# Patient Record
Sex: Female | Born: 1980 | Race: Black or African American | Hispanic: No | Marital: Single | Smoking: Current every day smoker
Health system: Southern US, Community
[De-identification: ages and names within clinical notes are randomized; demographics above are authoritative.]

## PROBLEM LIST (undated history)

## (undated) DIAGNOSIS — F32A Depression, unspecified: Secondary | ICD-10-CM

## (undated) DIAGNOSIS — B009 Herpesviral infection, unspecified: Secondary | ICD-10-CM

## (undated) DIAGNOSIS — F329 Major depressive disorder, single episode, unspecified: Secondary | ICD-10-CM

## (undated) HISTORY — DX: Depression, unspecified: F32.A

---
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
## 2005-12-17 ENCOUNTER — Emergency Department: Payer: Self-pay | Admitting: Emergency Medicine

## 2006-11-09 ENCOUNTER — Emergency Department: Payer: Self-pay | Admitting: Emergency Medicine

## 2007-01-23 ENCOUNTER — Emergency Department: Payer: Self-pay | Admitting: Emergency Medicine

## 2007-04-24 ENCOUNTER — Emergency Department: Payer: Self-pay | Admitting: Emergency Medicine

## 2008-07-06 DIAGNOSIS — A6000 Herpesviral infection of urogenital system, unspecified: Secondary | ICD-10-CM | POA: Insufficient documentation

## 2009-08-31 ENCOUNTER — Emergency Department: Payer: Self-pay | Admitting: Emergency Medicine

## 2010-08-09 ENCOUNTER — Emergency Department: Payer: Self-pay | Admitting: Internal Medicine

## 2012-01-11 ENCOUNTER — Emergency Department: Payer: Self-pay | Admitting: Emergency Medicine

## 2012-01-15 LAB — WOUND CULTURE

## 2014-06-07 ENCOUNTER — Emergency Department: Payer: Self-pay | Admitting: Emergency Medicine

## 2014-06-07 LAB — COMPREHENSIVE METABOLIC PANEL
AST: 22 U/L (ref 15–37)
Albumin: 3.8 g/dL (ref 3.4–5.0)
Alkaline Phosphatase: 80 U/L
Anion Gap: 7 (ref 7–16)
BUN: 16 mg/dL (ref 7–18)
Bilirubin,Total: 0.3 mg/dL (ref 0.2–1.0)
CHLORIDE: 106 mmol/L (ref 98–107)
Calcium, Total: 9.2 mg/dL (ref 8.5–10.1)
Co2: 28 mmol/L (ref 21–32)
Creatinine: 1.03 mg/dL (ref 0.60–1.30)
EGFR (African American): >60
EGFR (Non-African Amer.): >60
GLUCOSE: 121 mg/dL — AB (ref 65–99)
Osmolality: 284 (ref 275–301)
Potassium: 3.5 mmol/L (ref 3.5–5.1)
SGPT (ALT): 17 U/L
Sodium: 141 mmol/L (ref 136–145)
TOTAL PROTEIN: 7.7 g/dL (ref 6.4–8.2)

## 2014-06-07 LAB — CBC WITH DIFFERENTIAL/PLATELET
BASOS PCT: 0.5 %
Basophil #: 0.1 10*3/uL (ref 0.0–0.1)
EOS PCT: 0.9 %
Eosinophil #: 0.1 10*3/uL (ref 0.0–0.7)
HCT: 41.3 % (ref 35.0–47.0)
HGB: 13.4 g/dL (ref 12.0–16.0)
LYMPHS ABS: 3 10*3/uL (ref 1.0–3.6)
LYMPHS PCT: 29.8 %
MCH: 27.5 pg (ref 26.0–34.0)
MCHC: 32.3 g/dL (ref 32.0–36.0)
MCV: 85 fL (ref 80–100)
MONO ABS: 1.3 x10 3/mm — AB (ref 0.2–0.9)
MONOS PCT: 13.5 %
Neutrophil #: 5.5 10*3/uL (ref 1.4–6.5)
Neutrophil %: 55.3 %
Platelet: 219 10*3/uL (ref 150–440)
RBC: 4.85 10*6/uL (ref 3.80–5.20)
RDW: 14.8 % — ABNORMAL HIGH (ref 11.5–14.5)
WBC: 10 10*3/uL (ref 3.6–11.0)

## 2014-06-07 LAB — URINALYSIS, COMPLETE
BILIRUBIN, UR: NEGATIVE
Bacteria: NONE SEEN
Glucose,UR: NEGATIVE mg/dL (ref 0–75)
KETONE: NEGATIVE
Nitrite: NEGATIVE
PH: 5 (ref 4.5–8.0)
Protein: 30
RBC,UR: 8 /HPF (ref 0–5)
SPECIFIC GRAVITY: 1.028 (ref 1.003–1.030)
Squamous Epithelial: 1
WBC UR: 20 /HPF (ref 0–5)

## 2014-06-07 LAB — LIPASE, BLOOD: LIPASE: 166 U/L (ref 73–393)

## 2014-06-07 LAB — PREGNANCY, URINE: Pregnancy Test, Urine: NEGATIVE m[IU]/mL

## 2014-09-01 ENCOUNTER — Emergency Department: Payer: Self-pay | Admitting: Physician Assistant

## 2015-03-14 ENCOUNTER — Emergency Department: Payer: Self-pay

## 2015-03-14 ENCOUNTER — Emergency Department
Admission: EM | Admit: 2015-03-14 | Discharge: 2015-03-14 | Disposition: A | Discharge status: Home / Self Care | Payer: Self-pay | Attending: Emergency Medicine | Admitting: Emergency Medicine

## 2015-03-14 ENCOUNTER — Encounter: Payer: Self-pay | Admitting: Emergency Medicine

## 2015-03-14 DIAGNOSIS — R9389 Abnormal findings on diagnostic imaging of other specified body structures: Secondary | ICD-10-CM

## 2015-03-14 DIAGNOSIS — N8500 Endometrial hyperplasia, unspecified: Secondary | ICD-10-CM | POA: Insufficient documentation

## 2015-03-14 DIAGNOSIS — R102 Pelvic and perineal pain: Secondary | ICD-10-CM

## 2015-03-14 DIAGNOSIS — Z3202 Encounter for pregnancy test, result negative: Secondary | ICD-10-CM | POA: Insufficient documentation

## 2015-03-14 DIAGNOSIS — Z720 Tobacco use: Secondary | ICD-10-CM | POA: Insufficient documentation

## 2015-03-14 LAB — URINALYSIS COMPLETE WITH MICROSCOPIC (ARMC ONLY)
Bacteria, UA: NONE SEEN
Bilirubin Urine: NEGATIVE
Glucose, UA: NEGATIVE mg/dL
HGB URINE DIPSTICK: NEGATIVE
Ketones, ur: NEGATIVE mg/dL
LEUKOCYTES UA: NEGATIVE
NITRITE: NEGATIVE
PROTEIN: NEGATIVE mg/dL
SPECIFIC GRAVITY, URINE: 1.027 (ref 1.005–1.030)
pH: 5 (ref 5.0–8.0)

## 2015-03-14 LAB — CHLAMYDIA/NGC RT PCR (ARMC ONLY)
CHLAMYDIA TR: NOT DETECTED
N GONORRHOEAE: NOT DETECTED

## 2015-03-14 LAB — WET PREP, GENITAL
Clue Cells Wet Prep HPF POC: NONE SEEN
Trich, Wet Prep: NONE SEEN
YEAST WET PREP: NONE SEEN

## 2015-03-14 LAB — POCT PREGNANCY, URINE: PREG TEST UR: NEGATIVE

## 2015-03-14 NOTE — ED Notes (Signed)
Patient transported to Ultrasound 

## 2015-03-14 NOTE — ED Provider Notes (Signed)
[facility] Emergency Department Provider Note ____________________________________________  Time seen: 1026  I have reviewed the triage vital signs and the nursing notes.  HISTORY  Chief Complaint  Pelvic Pain and Flank Pain  HPI Jasmine Blankenship is a 34 y.o. female reports to the ED with 2-3 days complaint of lower abdominal pain and fullness as well as some urinary frequency. The patient describesthat she has been on Depo-Provera shots for the last 20 years due to menorrhagia and metrorrhagia. She also reports that this is her third visit over the last 6-12 months for complaints of pelvic discomfort. She each time is been treated for various other findings, but continues to note some pelvic discomfort, bloating, and fullness. She describes she does not have menstrual periods due to stepping shot, only noting some scant spotting once monthly. She reports that she is due for her annual physical exam and about 4 weeks, with the local health department. She describes the pain and discomfort is crampy and sharp at times she is also had an intermittent scant yellowish vaginal discharge which has been malodorous at times. She has not sought treatment at the health Department for her chronic complaint of pelvic discomfort. She is concerned that no one has done an ultrasound to determine the cause of her symptoms.  History reviewed. No pertinent past medical history.  There are no active problems to display for this patient.  History reviewed. No pertinent past surgical history.  Current Outpatient Rx  Name  Route  Sig  Dispense  Refill  . medroxyPROGESTERone (DEPO-PROVERA) 150 MG/ML injection   Intramuscular   Inject 150 mg into the muscle every 3 (three) months.          Allergies Review of patient's allergies indicates no known allergies.  History reviewed. No pertinent family history.  Social History Social History  Substance Use Topics  . Smoking status:  Current Every Day Smoker  . Smokeless tobacco: None  . Alcohol Use: Yes   Review of Systems  Constitutional: Negative for fever. Eyes: Negative for visual changes. ENT: Negative for sore throat. Cardiovascular: Negative for chest pain. Respiratory: Negative for shortness of breath. Gastrointestinal: Negative for abdominal pain, vomiting and diarrhea. Genitourinary: Negative for dysuria. Reports vaginal discharge and pelvic pain as above Musculoskeletal: Negative for back pain. Skin: Negative for rash. Neurological: Negative for headaches, focal weakness or numbness. ____________________________________________  PHYSICAL EXAM:  VITAL SIGNS: ED Triage Vitals  Enc Vitals Group     BP 03/14/15 0944 115/78 mmHg     Pulse Rate 03/14/15 0944 74     Resp 03/14/15 0944 18     Temp 03/14/15 0944 98.2 F (36.8 C)     Temp Source 03/14/15 0944 Oral     SpO2 03/14/15 0944 99 %     Weight 03/14/15 0944 151 lb (68.493 kg)     Height 03/14/15 0944  (1.778 m)     Head Cir --      Peak Flow --      Pain Score 03/14/15 0944 10     Pain Loc --      Pain Edu? --      Excl. in GC? --    Constitutional: Alert and oriented. Well appearing and in no distress. Eyes: Conjunctivae are normal. PERRL. Normal extraocular movements. ENT   Head: Normocephalic and atraumatic.   Nose: No congestion/rhinorrhea.   Mouth/Throat: Mucous membranes are moist.   Neck: Supple. No thyromegaly. Hematological/Lymphatic/Immunological: No cervical lymphadenopathy. Cardiovascular: Normal  rate, regular rhythm.  Respiratory: Normal respiratory effort. No wheezes/rales/rhonchi. Gastrointestinal: Soft and nontender. No distention. GU: Normal external genitalia. Cervix non-friable with nabothian cysts noted. Scant whitish discharge noted. No cervical tenderness or adnexal masses.  Musculoskeletal: Nontender with normal range of motion in all extremities.  Neurologic:  Normal gait without ataxia.  Normal speech and language. No gross focal neurologic deficits are appreciated. Skin:  Skin is warm, dry and intact. No rash noted. Psychiatric: Mood and affect are normal. Patient exhibits appropriate insight and judgment. ____________________________________________    LABS (pertinent positives/negatives) Labs Reviewed  WET PREP, GENITAL - Abnormal; Notable for the following:    WBC, Wet Prep HPF POC MANY (*)    All other components within normal limits  URINALYSIS COMPLETEWITH MICROSCOPIC ([facility] ONLY) - Abnormal; Notable for the following:    Color, Urine YELLOW (*)    APPearance CLEAR (*)    Squamous Epithelial / LPF 0-5 (*)    All other components within normal limits  CHLAMYDIA/NGC RT PCR ([facility] ONLY)  POC URINE PREG, ED  ____________________________________________   RADIOLOGY Pelvic U/S IMPRESSION: 1. Thickened heterogeneous endometrium, 18.4 mm. Endometrial thickness is considered abnormal. Consider follow-up by [place] in 6-8 weeks, during the week immediately following menses (exam timing is critical). 2. No discrete fibroids identified. 3. Normal appearance of the ovaries. ____________________________________________  INITIAL IMPRESSION / ASSESSMENT AND PLAN / ED COURSE  Reassurance to the patient regarding the findings on exam and lab testing. Endometrial thickening likely consistent with use of medroxyprogesterone for menorrhagia. Suggest follow-up with [facility] as scheduled.  ____________________________________________  FINAL CLINICAL IMPRESSION(S) / ED DIAGNOSES  Final diagnoses:  Pelvic pain in female  Endometrial thickening on ultra sound     Lissa Hoard, PA-C 03/14/15 1719  Sharman Cheek, MD 03/15/15 0900

## 2015-03-14 NOTE — ED Notes (Signed)
Patient reports 2-3 day history of lower abdominal pain along with pain in mid lower back, reports urinary frequency and odor to urine as well.

## 2015-03-14 NOTE — Discharge Instructions (Signed)
Pelvic Pain Female pelvic pain can be caused by many different things and start from a variety of places. Pelvic pain refers to pain that is located in the lower half of the abdomen and between your hips. The pain may occur over a short period of time (acute) or may be reoccurring (chronic). The cause of pelvic pain may be related to disorders affecting the female reproductive organs (gynecologic), but it may also be related to the bladder, kidney stones, an intestinal complication, or muscle or skeletal problems. Getting help right away for pelvic pain is important, especially if there has been severe, sharp, or a sudden onset of unusual pain. It is also important to get help right away because some types of pelvic pain can be life threatening.  CAUSES  Below are only some of the causes of pelvic pain. The causes of pelvic pain can be in one of several categories.   Gynecologic.  Pelvic inflammatory disease.  Sexually transmitted infection.  Ovarian cyst or a twisted ovarian ligament (ovarian torsion).  Uterine lining that grows outside the uterus (endometriosis).  Fibroids, cysts, or tumors.  Ovulation.  Pregnancy.  Pregnancy that occurs outside the uterus (ectopic pregnancy).  Miscarriage.  Labor.  Abruption of the placenta or ruptured uterus.  Infection.  Uterine infection (endometritis).  Bladder infection.  Diverticulitis.  Miscarriage related to a uterine infection (septic abortion).  Bladder.  Inflammation of the bladder (cystitis).  Kidney stone(s).  Gastrointestinal.  Constipation.  Diverticulitis.  Neurologic.  Trauma.  Feeling pelvic pain because of mental or emotional causes (psychosomatic).  Cancers of the bowel or pelvis. EVALUATION  Your caregiver will want to take a careful history of your concerns. This includes recent changes in your health, a careful gynecologic history of your periods (menses), and a sexual history. Obtaining your family  history and medical history is also important. Your caregiver may suggest a pelvic exam. A pelvic exam will help identify the location and severity of the pain. It also helps in the evaluation of which organ system may be involved. In order to identify the cause of the pelvic pain and be properly treated, your caregiver may order tests. These tests may include:   A pregnancy test.  Pelvic ultrasonography.  An X-ray exam of the abdomen.  A urinalysis or evaluation of vaginal discharge.  Blood tests. HOME CARE INSTRUCTIONS   Only take over-the-counter or prescription medicines for pain, discomfort, or fever as directed by your caregiver.   Rest as directed by your caregiver.   Eat a balanced diet.   Drink enough fluids to make your urine clear or pale yellow, or as directed.   Avoid sexual intercourse if it causes pain.   Apply warm or cold compresses to the lower abdomen depending on which one helps the pain.   Avoid stressful situations.   Keep a journal of your pelvic pain. Write down when it started, where the pain is located, and if there are things that seem to be associated with the pain, such as food or your menstrual cycle.  Follow up with your caregiver as directed.  SEEK MEDICAL CARE IF:  Your medicine does not help your pain.  You have abnormal vaginal discharge. SEEK IMMEDIATE MEDICAL CARE IF:   You have heavy bleeding from the vagina.   Your pelvic pain increases.   You feel light-headed or faint.   You have chills.   You have pain with urination or blood in your urine.   You have uncontrolled diarrhea   or vomiting.   You have a fever or persistent symptoms for more than 3 days.  You have a fever and your symptoms suddenly get worse.   You are being physically or sexually abused.  MAKE SURE YOU:  Understand these instructions.  Will watch your condition.  Will get help if you are not doing well or get worse. Document Released:  05/19/2004 Document Revised: 11/06/2013 Document Reviewed: 10/12/2011 [facility] Patient Information 2015 [place], [place]. This information is not intended to replace advice given to you by your health care provider. Make sure you discuss any questions you have with your health care provider.  Your exam shows thickening of the lining of the uterus.  This is the portion that sheds when you have a period. You should follow-up with your provider at the Health Department as scheduled next month. Discuss your treatment options and repeat the ultrasound with the health department within 2 months.

## 2016-07-06 HISTORY — PX: DILATION AND CURETTAGE OF UTERUS: SHX78

## 2017-03-05 IMAGING — US US TRANSVAGINAL NON-OB
1 series · 13 of 25 positions shown · non-contrast
Comparison: None

CLINICAL DATA: Pelvic pain for 1 year. Depo-Provera for 15 years.
Unknown LMP.

EXAM:
TRANSABDOMINAL AND TRANSVAGINAL ULTRASOUND OF PELVIS
TECHNIQUE: Both transabdominal and transvaginal ultrasound examinations of the
pelvis were performed. Transabdominal technique was performed for
global imaging of the pelvis including uterus, ovaries, adnexal
regions, and pelvic cul-de-sac. It was necessary to proceed with
endovaginal exam following the transabdominal exam to visualize the
endometrium and ovaries.

[Series 1: us transvaginal non-ob · 0.20mm/px · 13 of 66 slices shown]
[im 1/66]
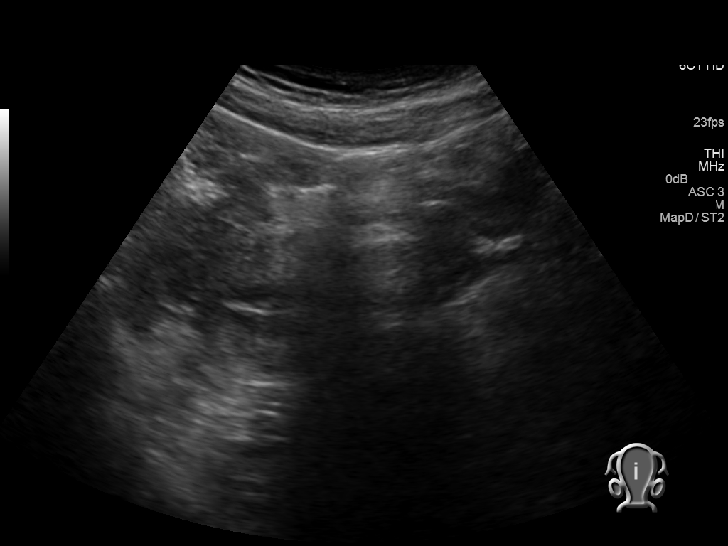
[im 6/66]
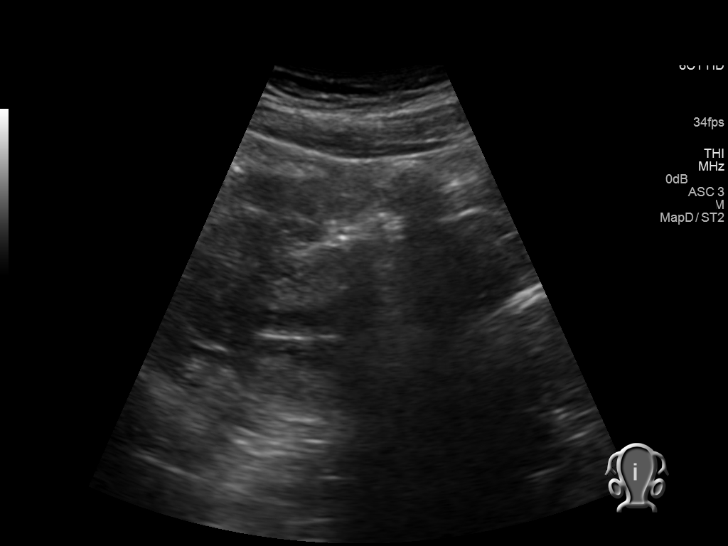
[im 11/66]
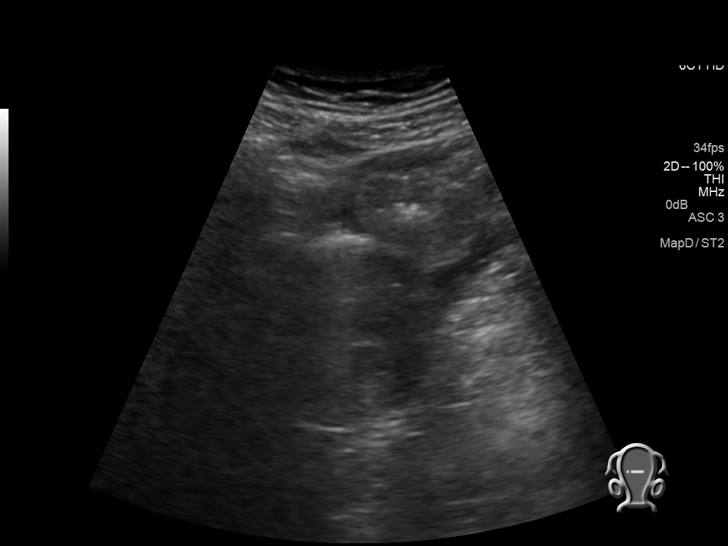
[im 17/66]
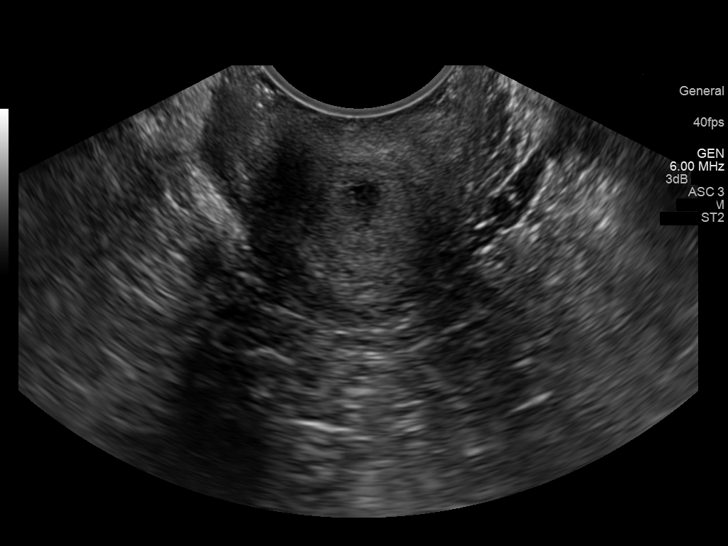
[im 22/66]
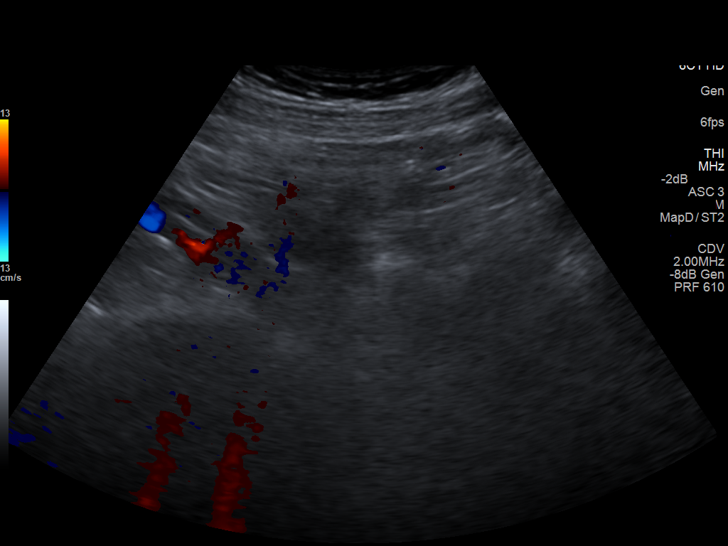
[im 28/66]
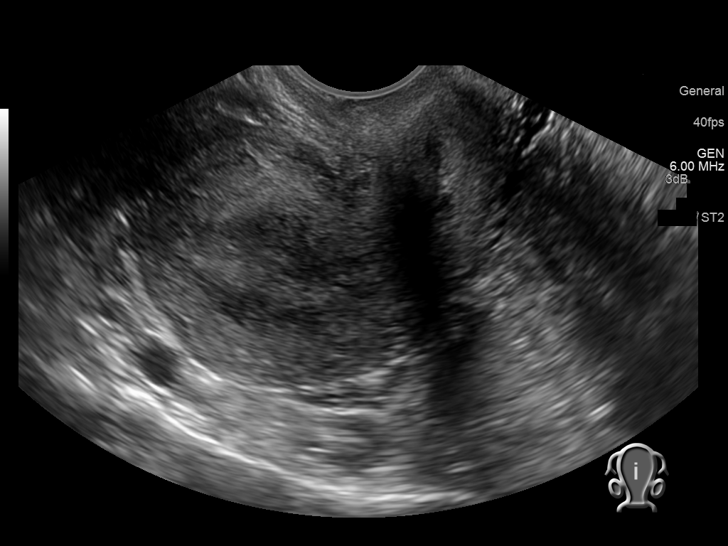
[im 33/66]
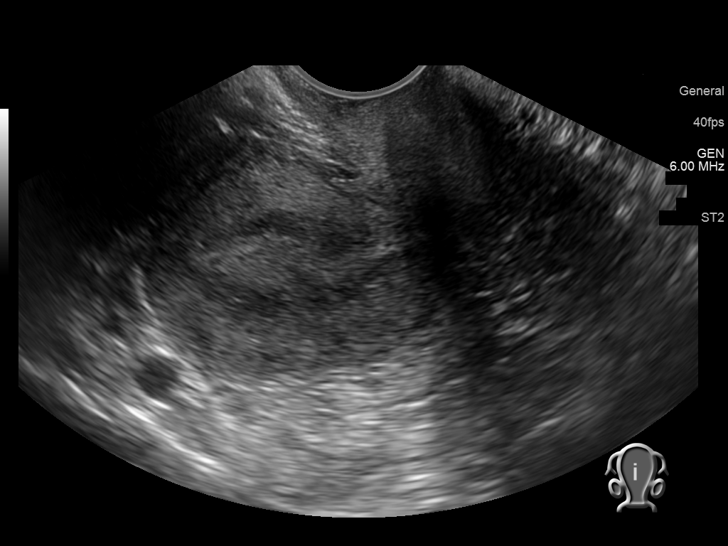
[im 38/66]
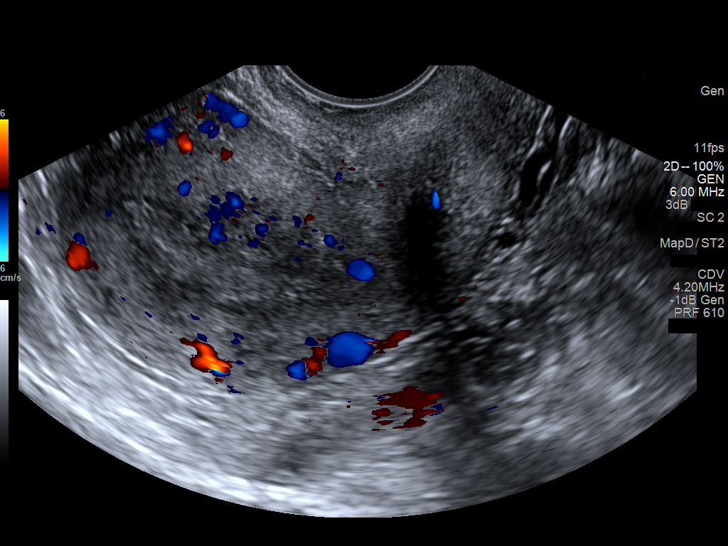
[im 44/66]
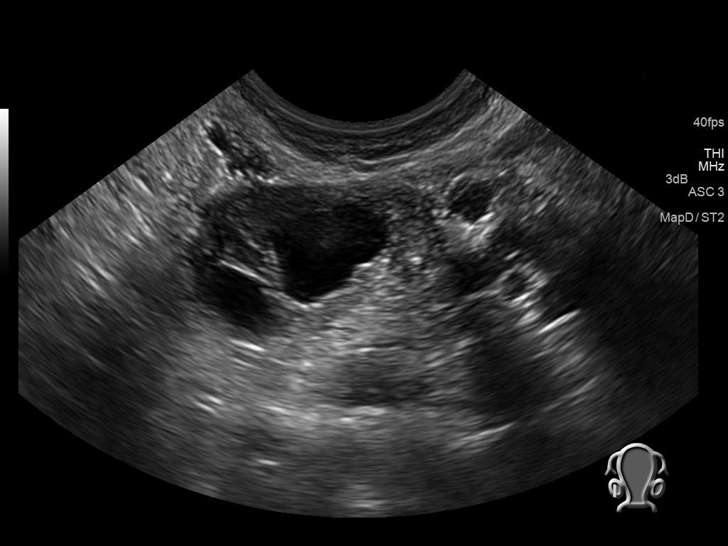
[im 49/66]
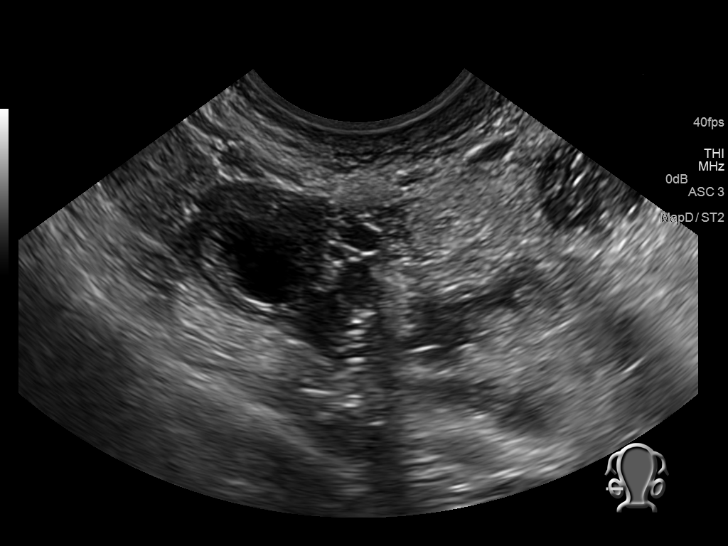
[im 55/66]
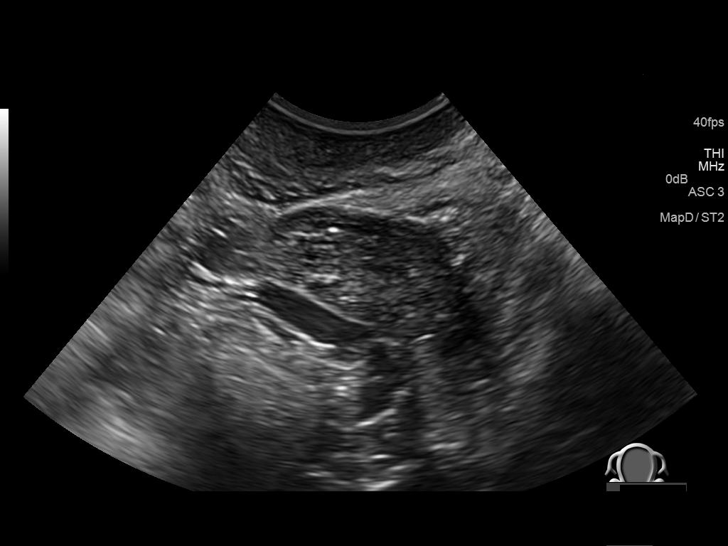
[im 60/66]
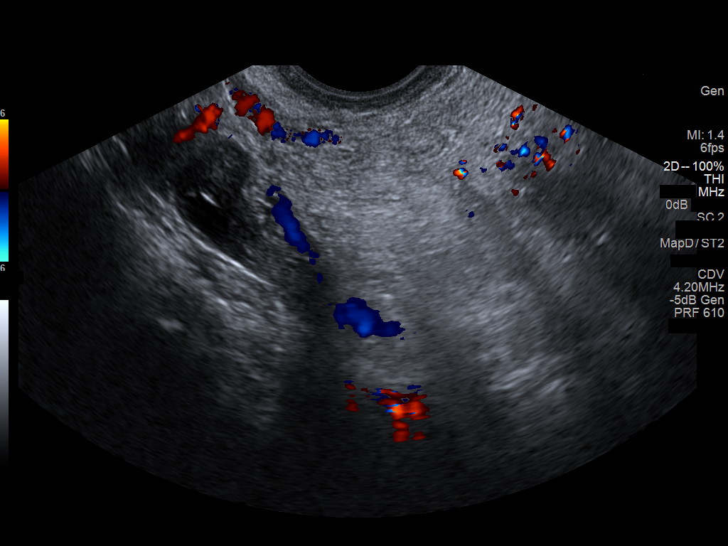
[im 66/66]
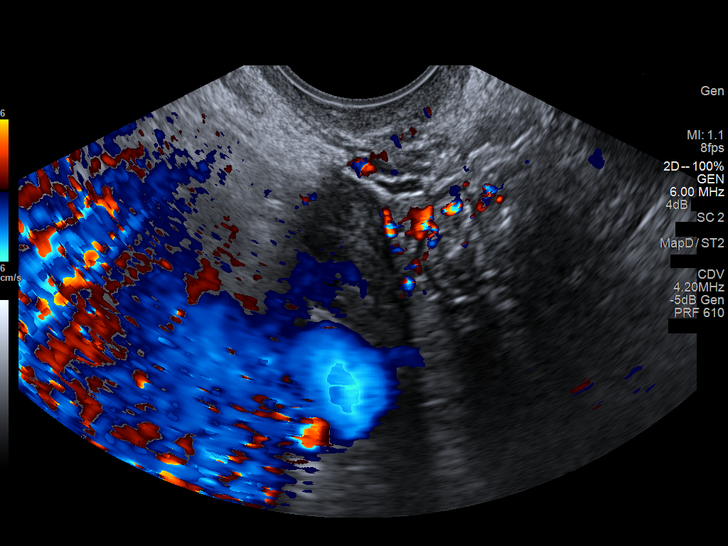

[13 of 25 positions shown; findings below may reference images not displayed]

FINDINGS: Uterus

Measurements: 8.4 x 4.0 x 5.3 cm.. No fibroids or other mass
visualized.

Endometrium

Thickness: 18.4 mm. It is heterogeneous and hypervascular. Fluid is
identified in the lower uterine segment.

Right ovary

Measurements: 2.7 x 1.6 x 2.9 cm. Follicles are present.

Left ovary

Measurements: 1.9 x 0.9 x 1.2 cm.. Follicles are present.

Other findings

No free pelvic fluid.
IMPRESSION: 1. Thickened heterogeneous endometrium, 18.4 mm. Endometrial
thickness is considered abnormal. Consider follow-up by US in 6-8
weeks, during the week immediately following menses (exam timing is
critical).
2. No discrete fibroids identified.
3. Normal appearance of the ovaries.

## 2017-05-07 ENCOUNTER — Encounter: Payer: Self-pay | Admitting: Emergency Medicine

## 2017-05-07 ENCOUNTER — Emergency Department
Admission: EM | Admit: 2017-05-07 | Discharge: 2017-05-07 | Disposition: A | Discharge status: Home / Self Care | Payer: Self-pay | Attending: Emergency Medicine | Admitting: Emergency Medicine

## 2017-05-07 DIAGNOSIS — N760 Acute vaginitis: Secondary | ICD-10-CM | POA: Insufficient documentation

## 2017-05-07 DIAGNOSIS — Z79899 Other long term (current) drug therapy: Secondary | ICD-10-CM | POA: Insufficient documentation

## 2017-05-07 DIAGNOSIS — B9689 Other specified bacterial agents as the cause of diseases classified elsewhere: Secondary | ICD-10-CM

## 2017-05-07 DIAGNOSIS — F17200 Nicotine dependence, unspecified, uncomplicated: Secondary | ICD-10-CM | POA: Insufficient documentation

## 2017-05-07 LAB — URINALYSIS, COMPLETE (UACMP) WITH MICROSCOPIC
BACTERIA UA: NONE SEEN
Bilirubin Urine: NEGATIVE
Glucose, UA: NEGATIVE mg/dL
Hgb urine dipstick: NEGATIVE
Ketones, ur: NEGATIVE mg/dL
NITRITE: NEGATIVE
PROTEIN: NEGATIVE mg/dL
SPECIFIC GRAVITY, URINE: 1.02 (ref 1.005–1.030)
pH: 5 (ref 5.0–8.0)

## 2017-05-07 LAB — CHLAMYDIA/NGC RT PCR (ARMC ONLY)
Chlamydia Tr: NOT DETECTED
N gonorrhoeae: NOT DETECTED

## 2017-05-07 LAB — WET PREP, GENITAL
SPERM: NONE SEEN
Trich, Wet Prep: NONE SEEN
YEAST WET PREP: NONE SEEN

## 2017-05-07 LAB — POCT PREGNANCY, URINE: PREG TEST UR: NEGATIVE

## 2017-05-07 MED ORDER — METRONIDAZOLE 500 MG PO TABS: 500.0000 mg | ORAL_TABLET | Freq: Two times a day (BID) | ORAL | 0 refills | Status: DC

## 2017-05-07 NOTE — Discharge Instructions (Signed)
Follow-up at the health department if any continued problems or doctor of your choice. Began taking Flagyl 500 mg twice a day for 7 days. Increase fluids. Tylenol or ibuprofen as needed for pain. You'll be called if the cultures are positive for anything.

## 2017-05-07 NOTE — ED Triage Notes (Signed)
Pt presents to ED c/o dysuria, foul odor , burning upon urination  X few weeks.

## 2017-05-07 NOTE — ED Provider Notes (Signed)
[facility] Emergency Department Provider Note  ___________________________________________   First MD Initiated Contact with Patient 05/07/17 1212     (approximate)  I have reviewed the triage vital signs and the nursing notes.   HISTORY  Chief Complaint Urinary Tract Infection  HPI Jasmine Blankenship is a 36 y.o. female for complaint of dysuria, foul odor  for a few weeks. Patient is sexually active and currently using Depo shot for birth control. patient denies any vaginal discharge but complains of odor.  History reviewed. No pertinent past medical history.  There are no active problems to display for this patient.   History reviewed. No pertinent surgical history.  Prior to Admission medications   Medication Sig Start Date End Date Taking? Authorizing Provider  medroxyPROGESTERone (DEPO-PROVERA) 150 MG/ML injection Inject 150 mg into the muscle every 3 (three) months. 03/13/1993   [provider]  metroNIDAZOLE (FLAGYL) 500 MG tablet Take 1 tablet (500 mg total) by mouth 2 (two) times daily. 05/07/17   Tommi RumpsSummers, Elza Sortor L, PA-C    Allergies Patient has no known allergies.  History reviewed. No pertinent family history.  Social History Social History  Substance Use Topics  . Smoking status: Current Every Day Smoker  . Smokeless tobacco: Current User  . Alcohol use Yes    Review of Systems Constitutional: No fever/chills Cardiovascular: Denies chest pain. Respiratory: Denies shortness of breath. Gastrointestinal: No abdominal pain.  No nausea, no vomiting.  No diarrhea.   Genitourinary: positive for dysuria. Denies vaginal discharge. Musculoskeletal: Negative for back pain. Skin: Negative for rash. Neurological: Negative for headaches ___________________________________________   PHYSICAL EXAM:  VITAL SIGNS: ED Triage Vitals [05/07/17 1137]  Enc Vitals Group     BP (!) 123/92     Pulse Rate 78     Resp 19     Temp 98.4 F  (36.9 C)     Temp Source Oral     SpO2 98 %     Weight 145 lb (65.8 kg)     Height 5\' 10"  (1.778 m)     Head Circumference      Peak Flow      Pain Score      Pain Loc      Pain Edu?      Excl. in GC?    Constitutional: Alert and oriented. Well appearing and in no acute distress. Eyes: Conjunctivae are normal.  Head: Atraumatic. Nose: No congestion/rhinnorhea. Neck: No stridor.   Cardiovascular: Normal rate, regular rhythm. Grossly normal heart sounds.  Good peripheral circulation. Respiratory: Normal respiratory effort.  No retractions. Lungs CTAB. Gastrointestinal: Soft and nontender. No distention.  Genitourinary:  Minimal discharge is noted vaginally. No external blistering is noted. Cultures were obtained. No adnexal masses or tenderness noted. Musculoskeletal: moves upper and lower extremities without difficulty.Normal gait was noted. Neurologic:  Normal speech and language. No gross focal neurologic deficits are appreciated.  Skin:  Skin is warm, dry and intact. No rash noted. Psychiatric: Mood and affect are normal. Speech and behavior are normal.  ____________________________________________   LABS (all labs ordered are listed, but only abnormal results are displayed)  Labs Reviewed  WET PREP, GENITAL - Abnormal; Notable for the following:       Result Value   Clue Cells Wet Prep HPF POC PRESENT (*)    WBC, Wet Prep HPF POC MODERATE (*)    All other components within normal limits  URINALYSIS, COMPLETE (UACMP) WITH MICROSCOPIC - Abnormal; Notable for the following:  Color, Urine YELLOW (*)    APPearance HAZY (*)    Leukocytes, UA TRACE (*)    Squamous Epithelial / LPF 0-5 (*)    All other components within normal limits  CHLAMYDIA/NGC RT PCR ([facility] ONLY)  POC URINE PREG, ED  POCT PREGNANCY, URINE    PROCEDURES  Procedure(s) performed: None  Procedures  Critical Care performed: No  ____________________________________________   INITIAL  IMPRESSION / ASSESSMENT AND PLAN / ED COURSE   Patient was made aware that she is being treated for bacterial vaginosis. Patient is given a prescription for Flagyl 500 mg for 7 days. Patient is to follow-up with provider of her choice or on his [place] health Department. She is aware that if her cultures show any problems then she will be called.  ____________________________________________   FINAL CLINICAL IMPRESSION(S) / ED DIAGNOSES  Final diagnoses:  BV (bacterial vaginosis)      NEW MEDICATIONS STARTED DURING THIS VISIT:  Discharge Medication List as of 05/07/2017  2:57 PM    START taking these medications   Details  metroNIDAZOLE (FLAGYL) 500 MG tablet Take 1 tablet (500 mg total) by mouth 2 (two) times daily., Starting Fri 05/07/2017, Print         Note:  This document was prepared using Dragon voice recognition software and may include unintentional dictation errors.    Tommi Rumps, PA-C 05/07/17 Dereck Ligas, MD 05/08/17 1630

## 2017-05-07 NOTE — ED Notes (Signed)
Pt c/o discomfort with urination, and foul odor after urination. 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 Pt states this has been going on x several weeks. Has tried OTC yeast infection treatment without relief.

## 2017-05-07 NOTE — ED Notes (Signed)
NAD noted at time of D/C. Pt denies questions or concerns. Pt ambulatory to the lobby at this time.  

## 2017-08-20 LAB — HM PAP SMEAR: HM Pap smear: NEGATIVE

## 2018-07-02 ENCOUNTER — Emergency Department
Admission: EM | Admit: 2018-07-02 | Discharge: 2018-07-02 | Disposition: A | Discharge status: Home / Self Care | Payer: Self-pay | Attending: Emergency Medicine | Admitting: Emergency Medicine

## 2018-07-02 ENCOUNTER — Other Ambulatory Visit: Payer: Self-pay

## 2018-07-02 ENCOUNTER — Encounter: Payer: Self-pay | Admitting: Emergency Medicine

## 2018-07-02 DIAGNOSIS — N898 Other specified noninflammatory disorders of vagina: Secondary | ICD-10-CM | POA: Insufficient documentation

## 2018-07-02 DIAGNOSIS — F329 Major depressive disorder, single episode, unspecified: Secondary | ICD-10-CM | POA: Insufficient documentation

## 2018-07-02 DIAGNOSIS — F17210 Nicotine dependence, cigarettes, uncomplicated: Secondary | ICD-10-CM | POA: Insufficient documentation

## 2018-07-02 HISTORY — DX: Herpesviral infection, unspecified: B00.9

## 2018-07-02 LAB — COMPREHENSIVE METABOLIC PANEL
ALT: 17 U/L (ref 0–44)
ANION GAP: 5 (ref 5–15)
AST: 20 U/L (ref 15–41)
Albumin: 4.2 g/dL (ref 3.5–5.0)
Alkaline Phosphatase: 68 U/L (ref 38–126)
BUN: 10 mg/dL (ref 6–20)
CALCIUM: 9.2 mg/dL (ref 8.9–10.3)
CHLORIDE: 106 mmol/L (ref 98–111)
CO2: 27 mmol/L (ref 22–32)
Creatinine, Ser: 0.91 mg/dL (ref 0.44–1.00)
GFR calc non Af Amer: >60 mL/min (ref >60)
Glucose, Bld: 102 mg/dL — ABNORMAL HIGH (ref 70–99)
Potassium: 3.8 mmol/L (ref 3.5–5.1)
SODIUM: 138 mmol/L (ref 135–145)
Total Bilirubin: 0.4 mg/dL (ref 0.3–1.2)
Total Protein: 7.6 g/dL (ref 6.5–8.1)

## 2018-07-02 LAB — URINALYSIS, ROUTINE W REFLEX MICROSCOPIC
Bilirubin Urine: NEGATIVE
Glucose, UA: NEGATIVE mg/dL
HGB URINE DIPSTICK: NEGATIVE
KETONES UR: NEGATIVE mg/dL
Leukocytes, UA: NEGATIVE
Nitrite: NEGATIVE
PROTEIN: NEGATIVE mg/dL
Specific Gravity, Urine: 1.005 (ref 1.005–1.030)
pH: 5 (ref 5.0–8.0)

## 2018-07-02 LAB — WET PREP, GENITAL
Clue Cells Wet Prep HPF POC: NONE SEEN
Sperm: NONE SEEN
TRICH WET PREP: NONE SEEN
WBC, Wet Prep HPF POC: NONE SEEN
YEAST WET PREP: NONE SEEN

## 2018-07-02 LAB — ETHANOL: Alcohol, Ethyl (B): 122 mg/dL — ABNORMAL HIGH (ref <10)

## 2018-07-02 LAB — CBC
HEMATOCRIT: 43.5 % (ref 36.0–46.0)
HEMOGLOBIN: 13.9 g/dL (ref 12.0–15.0)
MCH: 26.8 pg (ref 26.0–34.0)
MCHC: 32 g/dL (ref 30.0–36.0)
MCV: 84 fL (ref 80.0–100.0)
NRBC: 0 % (ref 0.0–0.2)
Platelets: 261 10*3/uL (ref 150–400)
RBC: 5.18 MIL/uL — ABNORMAL HIGH (ref 3.87–5.11)
RDW: 15.3 % (ref 11.5–15.5)
WBC: 7.1 10*3/uL (ref 4.0–10.5)

## 2018-07-02 LAB — URINE DRUG SCREEN, QUALITATIVE (ARMC ONLY)
AMPHETAMINES, UR SCREEN: NOT DETECTED
BENZODIAZEPINE, UR SCRN: NOT DETECTED
Barbiturates, Ur Screen: NOT DETECTED
Cannabinoid 50 Ng, Ur ~~LOC~~: NOT DETECTED
Cocaine Metabolite,Ur ~~LOC~~: NOT DETECTED
MDMA (Ecstasy)Ur Screen: NOT DETECTED
METHADONE SCREEN, URINE: NOT DETECTED
OPIATE, UR SCREEN: NOT DETECTED
Phencyclidine (PCP) Ur S: NOT DETECTED
TRICYCLIC, UR SCREEN: NOT DETECTED

## 2018-07-02 LAB — POCT PREGNANCY, URINE: Preg Test, Ur: NEGATIVE

## 2018-07-02 NOTE — ED Triage Notes (Signed)
FIRST NURSE NOTE- ambulatory, NAD 

## 2018-07-02 NOTE — ED Triage Notes (Addendum)
Pt says "I think I might have a little BV", "the smell"; or she says it might be a yeast infection; having gray/white discharge and itching; pt says she recently had unprotected intercourse with a new partner; pt denies pain; pt tearful in triage; says she has herpes and thinks she is having an outbreak as well;

## 2018-07-02 NOTE — ED Notes (Signed)
Pt crying in triage; first she's embarrassed about being here with the issues she has; says she's gotten infections before from unprotected sex; pt says she's also depressed; just feeling "really shitty"; drinking daily, everything and anything she can get to drink she does;

## 2018-07-02 NOTE — ED Provider Notes (Signed)
[facility] Emergency Department Provider Note  ____________________________________________   I have reviewed the triage vital signs and the nursing notes.   HISTORY  Chief Complaint Vaginal Discharge and Depression   History limited by: Not Limited   HPI Jasmine Blankenship is a 37 y.o. female who presents to the emergency department today because of concerns for abnormal vaginal discharge.  She states that this started yesterday.  It has been accompanied by a bad odor.  The patient states that she did recently started a new relationship and had unprotected intercourse.  The patient denies any fevers, denies any nausea or vomiting.  Patient does state that she feels somewhat depressed but denies any thoughts about wanting to hurt her self.   Per medical record review patient has a history of herpes  Past Medical History:  Diagnosis Date  . Herpes     There are no active problems to display for this patient.   History reviewed. No pertinent surgical history.  Prior to Admission medications   Not on File    Allergies Patient has no known allergies.  History reviewed. No pertinent family history.  Social History Social History   Tobacco Use  . Smoking status: Current Every Day Smoker    Types: Cigarettes  . Smokeless tobacco: Never Used  Substance Use Topics  . Alcohol use: Yes    Comment: drinks daily  . Drug use: Not Currently    Review of Systems Constitutional: No fever/chills Eyes: No visual changes. ENT: No sore throat. Cardiovascular: Denies chest pain. Respiratory: Denies shortness of breath. Gastrointestinal: No abdominal pain.  No nausea, no vomiting.  No diarrhea.   Genitourinary: Positive for vaginal discharge and bad odor Musculoskeletal: Negative for back pain. Skin: Negative for rash. Neurological: Negative for headaches, focal weakness or numbness.  ____________________________________________   PHYSICAL  EXAM:  VITAL SIGNS: ED Triage Vitals  Enc Vitals Group     BP 07/02/18 2040 115/75     Pulse Rate 07/02/18 2040 (!) 102     Resp 07/02/18 2040 18     Temp 07/02/18 2040 98.3 F (36.8 C)     Temp Source 07/02/18 2040 Oral     SpO2 07/02/18 2040 97 %     Weight 07/02/18 2041 140 lb (63.5 kg)     Height 07/02/18 2041 5' 9.5" (1.765 m)     Head Circumference --      Peak Flow --      Pain Score 07/02/18 2041 0   Constitutional: Alert and oriented.  Eyes: Conjunctivae are normal.  ENT      Head: Normocephalic and atraumatic.      Nose: No congestion/rhinnorhea.      Mouth/Throat: Mucous membranes are moist.      Neck: No stridor. Hematological/Lymphatic/Immunilogical: No cervical lymphadenopathy. Cardiovascular: Normal rate, regular rhythm.  No murmurs, rubs, or gallops.  Respiratory: Normal respiratory effort without tachypnea nor retractions. Breath sounds are clear and equal bilaterally. No wheezes/rales/rhonchi. Gastrointestinal: Soft and non tender. No rebound. No guarding.  Genitourinary: Pelvic exam performed with Darl PikesSusan, RN.  No external lesions.  Moderate amount of discharge in vaginal vault.  No cervical motion tenderness. Musculoskeletal: Normal range of motion in all extremities. No lower extremity edema. Neurologic:  Normal speech and language. No gross focal neurologic deficits are appreciated.  Skin:  Skin is warm, dry and intact. No rash noted. Psychiatric: Mood and affect are normal. Speech and behavior are normal. Patient exhibits appropriate insight and judgment.  ____________________________________________    LABS (pertinent positives/negatives)  Upreg neg UA clear unremarkable CMP wnl except glu 102 CBC wbc 7.1, hgb 13.9, plt 261  ____________________________________________   EKG  None  ____________________________________________     RADIOLOGY  None  ____________________________________________   PROCEDURES  Procedures  ____________________________________________   INITIAL IMPRESSION / ASSESSMENT AND PLAN / ED COURSE  Pertinent labs & imaging results that were available during my care of the patient were reviewed by me and considered in my medical decision making (see chart for details).   Patient presented to the emergency department today because of concerns for abnormal vaginal discharge.  On exam patient without any external lesions although did have a moderate amount of vaginal discharge.  Wet prep was negative.  Did discuss with patient that we sent out other swabs and that she will be contacted if they are positive.  Patient also expressed some depression although denies any SI.  I did ask patient with want to speak to a psychiatrist today however she declined.  __________________   FINAL CLINICAL IMPRESSION(S) / ED DIAGNOSES  Final diagnoses:  Vaginal discharge     Note: This dictation was prepared with Dragon dictation. Any transcriptional errors that result from this process are unintentional     Phineas SemenGoodman, Gisselle Galvis, MD 07/02/18 [phone]

## 2018-07-02 NOTE — ED Notes (Signed)
No peripheral IV placed this visit.   Discharge instructions reviewed with patient. Questions fielded by this RN. Patient verbalizes understanding of instructions. Patient discharged home in stable condition per sung. No acute distress noted at time of discharge.   

## 2018-07-02 NOTE — Discharge Instructions (Addendum)
Please seek medical attention for any high fevers, chest pain, shortness of breath, change in behavior, persistent vomiting, bloody stool or any other new or concerning symptoms.  

## 2018-07-03 LAB — CHLAMYDIA/NGC RT PCR (ARMC ONLY)
CHLAMYDIA TR: NOT DETECTED
N gonorrhoeae: NOT DETECTED

## 2018-07-04 LAB — HIV ANTIBODY (ROUTINE TESTING W REFLEX): HIV Screen 4th Generation wRfx: NONREACTIVE

## 2018-07-04 LAB — RPR: RPR: NONREACTIVE

## 2018-12-19 ENCOUNTER — Encounter: Payer: Self-pay | Admitting: Emergency Medicine

## 2018-12-19 ENCOUNTER — Emergency Department
Admission: EM | Admit: 2018-12-19 | Discharge: 2018-12-19 | Disposition: A | Discharge status: Home / Self Care | Payer: Self-pay | Attending: Emergency Medicine | Admitting: Emergency Medicine

## 2018-12-19 ENCOUNTER — Other Ambulatory Visit: Payer: Self-pay

## 2018-12-19 DIAGNOSIS — N390 Urinary tract infection, site not specified: Secondary | ICD-10-CM | POA: Insufficient documentation

## 2018-12-19 DIAGNOSIS — R350 Frequency of micturition: Secondary | ICD-10-CM | POA: Insufficient documentation

## 2018-12-19 DIAGNOSIS — R300 Dysuria: Secondary | ICD-10-CM | POA: Insufficient documentation

## 2018-12-19 DIAGNOSIS — R1030 Lower abdominal pain, unspecified: Secondary | ICD-10-CM | POA: Insufficient documentation

## 2018-12-19 DIAGNOSIS — F17210 Nicotine dependence, cigarettes, uncomplicated: Secondary | ICD-10-CM | POA: Insufficient documentation

## 2018-12-19 LAB — URINALYSIS, COMPLETE (UACMP) WITH MICROSCOPIC
Bacteria, UA: NONE SEEN
Bilirubin Urine: NEGATIVE
Glucose, UA: NEGATIVE mg/dL
Ketones, ur: NEGATIVE mg/dL
Nitrite: NEGATIVE
Protein, ur: 100 mg/dL — AB
Specific Gravity, Urine: 1.028 (ref 1.005–1.030)
WBC, UA: >50 WBC/hpf — ABNORMAL HIGH (ref 0–5)
pH: 5 (ref 5.0–8.0)

## 2018-12-19 LAB — COMPREHENSIVE METABOLIC PANEL
ALT: 15 U/L (ref 0–44)
AST: 21 U/L (ref 15–41)
Albumin: 4.3 g/dL (ref 3.5–5.0)
Alkaline Phosphatase: 75 U/L (ref 38–126)
Anion gap: 12 (ref 5–15)
BUN: 10 mg/dL (ref 6–20)
CO2: 27 mmol/L (ref 22–32)
Calcium: 9.7 mg/dL (ref 8.9–10.3)
Chloride: 102 mmol/L (ref 98–111)
Creatinine, Ser: 0.85 mg/dL (ref 0.44–1.00)
GFR calc Af Amer: >60 mL/min (ref >60)
GFR calc non Af Amer: >60 mL/min (ref >60)
Glucose, Bld: 111 mg/dL — ABNORMAL HIGH (ref 70–99)
Potassium: 4.1 mmol/L (ref 3.5–5.1)
Sodium: 141 mmol/L (ref 135–145)
Total Bilirubin: 0.8 mg/dL (ref 0.3–1.2)
Total Protein: 7.9 g/dL (ref 6.5–8.1)

## 2018-12-19 LAB — CBC WITH DIFFERENTIAL/PLATELET
Abs Immature Granulocytes: 0.01 10*3/uL (ref 0.00–0.07)
Basophils Absolute: 0 10*3/uL (ref 0.0–0.1)
Basophils Relative: 0 %
Eosinophils Absolute: 0.1 10*3/uL (ref 0.0–0.5)
Eosinophils Relative: 1 %
HCT: 42.8 % (ref 36.0–46.0)
Hemoglobin: 14 g/dL (ref 12.0–15.0)
Immature Granulocytes: 0 %
Lymphocytes Relative: 27 %
Lymphs Abs: 2.2 10*3/uL (ref 0.7–4.0)
MCH: 27.7 pg (ref 26.0–34.0)
MCHC: 32.7 g/dL (ref 30.0–36.0)
MCV: 84.6 fL (ref 80.0–100.0)
Monocytes Absolute: 1.2 10*3/uL — ABNORMAL HIGH (ref 0.1–1.0)
Monocytes Relative: 14 %
Neutro Abs: 4.9 10*3/uL (ref 1.7–7.7)
Neutrophils Relative %: 58 %
Platelets: 220 10*3/uL (ref 150–400)
RBC: 5.06 MIL/uL (ref 3.87–5.11)
RDW: 15.9 % — ABNORMAL HIGH (ref 11.5–15.5)
WBC: 8.4 10*3/uL (ref 4.0–10.5)
nRBC: 0 % (ref 0.0–0.2)

## 2018-12-19 LAB — POCT PREGNANCY, URINE: Preg Test, Ur: NEGATIVE

## 2018-12-19 MED ORDER — NITROFURANTOIN MONOHYD MACRO 100 MG PO CAPS: 100.0000 mg | ORAL_CAPSULE | Freq: Two times a day (BID) | ORAL | 0 refills | Status: AC

## 2018-12-19 MED ORDER — NITROFURANTOIN MONOHYD MACRO 100 MG PO CAPS
100.0000 mg | ORAL_CAPSULE | Freq: Once | ORAL | Status: AC
  Administered 2018-12-19: 100 mg via ORAL
  Filled 2018-12-19: qty 1

## 2018-12-19 NOTE — Discharge Instructions (Addendum)
Please seek medical attention for any high fevers, chest pain, shortness of breath, change in behavior, persistent vomiting, bloody stool or any other new or concerning symptoms.  

## 2018-12-19 NOTE — ED Triage Notes (Signed)
Patient ambulatory to triage with steady gait, without difficulty or distress noted, mask in place; pt reports lower back pain with urinary frequency and dysuria "for awhile"

## 2018-12-19 NOTE — ED Provider Notes (Signed)
[facility] Emergency Department Provider Note  ____________________________________________   I have reviewed the triage vital signs and the nursing notes.   HISTORY  Chief Complaint Back Pain   History limited by: Not Limited   HPI Opal SidlesDanica L Arlana Pouchate is a 38 y.o. female who presents to the emergency department today because of concerns for possible urinary tract infection.  Patient states that for the past month or so she has been having some discomfort in her lower back.  More recently however she has started having lower abdominal pain, dysuria and increased frequency of urination.  The patient denies any fevers.  Denies any immunocompromising disease.  Records reviewed. Per medical record review patient has a history of BV, herpes.   Past Medical History:  Diagnosis Date  . Herpes     There are no active problems to display for this patient.   History reviewed. No pertinent surgical history.  Prior to Admission medications   Medication Sig Start Date End Date Taking? Authorizing Provider  nitrofurantoin, macrocrystal-monohydrate, (MACROBID) 100 MG capsule Take 1 capsule (100 mg total) by mouth 2 (two) times daily for 5 days. 12/19/18 12/24/18  Phineas SemenGoodman, Lezli Danek, MD    Allergies Patient has no known allergies.  No family history on file.  Social History Social History   Tobacco Use  . Smoking status: Current Every Day Smoker    Types: Cigarettes  . Smokeless tobacco: Never Used  Substance Use Topics  . Alcohol use: Yes    Comment: drinks daily  . Drug use: Not Currently    Review of Systems Constitutional: No fever/chills Eyes: No visual changes. ENT: No sore throat. Cardiovascular: Denies chest pain. Respiratory: Denies shortness of breath. Gastrointestinal: Positive for lower abdominal pain Genitourinary: Positive for dysuria.  Musculoskeletal: Positive for lower back pain.  Skin: Negative for rash. Neurological: Negative for  headaches, focal weakness or numbness.  ____________________________________________   PHYSICAL EXAM:  VITAL SIGNS: ED Triage Vitals [12/19/18 1930]  Enc Vitals Group     BP (!) 168/94     Pulse Rate 87     Resp 18     Temp 97.8 F (36.6 C)     Temp Source Oral     SpO2 100 %     Weight      Height      Head Circumference      Peak Flow      Pain Score 10   Constitutional: Alert and oriented.  Eyes: Conjunctivae are normal.  ENT      Head: Normocephalic and atraumatic.      Nose: No congestion/rhinnorhea.      Mouth/Throat: Mucous membranes are moist.      Neck: No stridor. Hematological/Lymphatic/Immunilogical: No cervical lymphadenopathy. Cardiovascular: Normal rate, regular rhythm.  No murmurs, rubs, or gallops.  Respiratory: Normal respiratory effort without tachypnea nor retractions. Breath sounds are clear and equal bilaterally. No wheezes/rales/rhonchi. Gastrointestinal: Soft and non tender. No rebound. No guarding.  Genitourinary: Deferred Musculoskeletal: Normal range of motion in all extremities. No lower extremity edema. Neurologic:  Normal speech and language. No gross focal neurologic deficits are appreciated.  Skin:  Skin is warm, dry and intact. No rash noted. Psychiatric: Mood and affect are normal. Speech and behavior are normal. Patient exhibits appropriate insight and judgment.  ____________________________________________    LABS (pertinent positives/negatives)  Upreg negative CMP wnl except glu 111 UA hazy, small leukocytes, >50 wbc CBC wbc 8.4, hgb 14.0, plt 220  ____________________________________________   EKG  None  ____________________________________________    RADIOLOGY  None  ____________________________________________   PROCEDURES  Procedures  ____________________________________________   INITIAL IMPRESSION / ASSESSMENT AND PLAN / ED COURSE  Pertinent labs & imaging results that were available during my care  of the patient were reviewed by me and considered in my medical decision making (see chart for details).   Patient presented to the emergency department today because of concerns for abdominal pain, dysuria and is some back discomfort.  Urinalysis is consistent with a urinary tract infection.  No CVA tenderness to suggest pyelonephritis patient without leukocytosis on blood work.  Will give patient first dose of antibiotics here in the emergency department.  Discussed findings and plan with patient.  ____________________________________________   FINAL CLINICAL IMPRESSION(S) / ED DIAGNOSES  Final diagnoses:  Lower urinary tract infectious disease     Note: This dictation was prepared with Dragon dictation. Any transcriptional errors that result from this process are unintentional     Nance Pear, MD 12/19/18 2247

## 2018-12-23 LAB — HM HIV SCREENING LAB: HM HIV Screening: NEGATIVE

## 2019-01-09 ENCOUNTER — Emergency Department
Admission: EM | Admit: 2019-01-09 | Discharge: 2019-01-09 | Disposition: A | Discharge status: Home / Self Care | Payer: Self-pay | Attending: Emergency Medicine | Admitting: Emergency Medicine

## 2019-01-09 ENCOUNTER — Other Ambulatory Visit: Payer: Self-pay

## 2019-01-09 DIAGNOSIS — F17210 Nicotine dependence, cigarettes, uncomplicated: Secondary | ICD-10-CM | POA: Insufficient documentation

## 2019-01-09 DIAGNOSIS — H01001 Unspecified blepharitis right upper eyelid: Secondary | ICD-10-CM | POA: Insufficient documentation

## 2019-01-09 MED ORDER — EYE WASH OPHTH SOLN
2.0000 [drp] | OPHTHALMIC | Status: DC | PRN
  Filled 2019-01-09: qty 118

## 2019-01-09 MED ORDER — HYDROXYZINE HCL 50 MG PO TABS: 50.0000 mg | ORAL_TABLET | Freq: Three times a day (TID) | ORAL | 0 refills | Status: DC | PRN

## 2019-01-09 MED ORDER — METHYLPREDNISOLONE 4 MG PO TBPK: ORAL_TABLET | ORAL | 0 refills | Status: DC

## 2019-01-09 MED ORDER — HYDROXYZINE HCL 50 MG PO TABS
50.0000 mg | ORAL_TABLET | Freq: Once | ORAL | Status: AC
  Administered 2019-01-09: 50 mg via ORAL
  Filled 2019-01-09: qty 1

## 2019-01-09 MED ORDER — PREDNISONE 20 MG PO TABS
60.0000 mg | ORAL_TABLET | Freq: Once | ORAL | Status: AC
  Administered 2019-01-09: 60 mg via ORAL
  Filled 2019-01-09: qty 3

## 2019-01-09 MED ORDER — TETRACAINE HCL 0.5 % OP SOLN
2.0000 [drp] | Freq: Once | OPHTHALMIC | Status: AC
  Administered 2019-01-09: 2 [drp] via OPHTHALMIC
  Filled 2019-01-09: qty 4

## 2019-01-09 MED ORDER — FLUORESCEIN SODIUM 1 MG OP STRP
1.0000 | ORAL_STRIP | Freq: Once | OPHTHALMIC | Status: AC
  Administered 2019-01-09: 1 via OPHTHALMIC
  Filled 2019-01-09: qty 1

## 2019-01-09 MED ORDER — TOBRAMYCIN 0.3 % OP SOLN: 1.0000 [drp] | OPHTHALMIC | 0 refills | Status: DC

## 2019-01-09 NOTE — ED Notes (Addendum)
See triage note   Presents with swelling and drainage from right eye  States she thinks she mat have been stung by something

## 2019-01-09 NOTE — ED Provider Notes (Signed)
[facility] Emergency Department Provider Note   ____________________________________________   First MD Initiated Contact with Patient 01/09/19 1204     (approximate)  I have reviewed the triage vital signs and the nursing notes.   HISTORY  Chief Complaint Eye Problem    HPI Jasmine Blankenship is a 38 y.o. female patient awakened this morning with right upper eyelid swelling and drainage.  Patient states she went to bed and slept over friend's house last night without any complaint.  Awakened this morning with right upper eyelid edema.  Patient is a vision is only hampered by the edema.  Patient denies pain but is very anxious secondary to the edema.      Past Medical History:  Diagnosis Date  . Herpes     There are no active problems to display for this patient.   History reviewed. No pertinent surgical history.  Prior to Admission medications   Medication Sig Start Date End Date Taking? Authorizing Provider  hydrOXYzine (ATARAX/VISTARIL) 50 MG tablet Take 1 tablet (50 mg total) by mouth 3 (three) times daily as needed for itching. 01/09/19   Joni ReiningSmith,  K, PA-C  methylPREDNISolone (MEDROL DOSEPAK) 4 MG TBPK tablet Take Tapered dose as directed 01/09/19   Joni ReiningSmith,  K, PA-C  tobramycin (TOBREX) 0.3 % ophthalmic solution Place 1 drop into the right eye every 4 (four) hours for 10 days. 01/09/19 01/19/19  Joni ReiningSmith,  K, PA-C    Allergies Patient has no known allergies.  No family history on file.  Social History Social History   Tobacco Use  . Smoking status: Current Every Day Smoker    Types: Cigarettes  . Smokeless tobacco: Never Used  Substance Use Topics  . Alcohol use: Yes    Comment: drinks daily  . Drug use: Not Currently    Review of Systems Constitutional: No fever/chills Eyes: No visual changes.  Right upper eyelid lid edema. ENT: No sore throat. Cardiovascular: Denies chest pain. Respiratory: Denies shortness of breath.  Gastrointestinal: No abdominal pain.  No nausea, no vomiting.  No diarrhea.  No constipation. Genitourinary: Negative for dysuria. Musculoskeletal: Negative for back pain. Skin: Negative for rash. Neurological: Negative for headaches, focal weakness or numbness.   ____________________________________________   PHYSICAL EXAM:  VITAL SIGNS: ED Triage Vitals  Enc Vitals Group     BP 01/09/19 1115 123/83     Pulse Rate 01/09/19 1115 88     Resp 01/09/19 1115 17     Temp 01/09/19 1115 98.9 F (37.2 C)     Temp Source 01/09/19 1115 Oral     SpO2 01/09/19 1115 97 %     Weight 01/09/19 1117 145 lb (65.8 kg)     Height 01/09/19 1117 5\' 9"  (1.753 m)     Head Circumference --      Peak Flow --      Pain Score --      Pain Loc --      Pain Edu? --      Excl. in GC? --     Constitutional: Alert and oriented. Well appearing and in no acute distress.  Anxious. Eyes: Right upper eyelid edema.  Conjunctivae are normal. PERRL. EOMI. Nose: No congestion/rhinnorhea. Mouth/Throat: Mucous membranes are moist.  Oropharynx non-erythematous. Cardiovascular: Normal rate, regular rhythm. Grossly normal heart sounds.  Good peripheral circulation. Respiratory: Normal respiratory effort.  No retractions. Lungs CTAB. Neurologic:  Normal speech and language. No gross focal neurologic deficits are appreciated. No gait instability. Skin:  Skin is warm, dry and intact. No rash noted.  Edema to right upper eyelid. Psychiatric: Mood and affect are normal. Speech and behavior are normal.  ____________________________________________   LABS (all labs ordered are listed, but only abnormal results are displayed)  Labs Reviewed - No data to display ____________________________________________  EKG   ____________________________________________  RADIOLOGY  ED MD interpretation:    Official radiology report(s): No results found.  ____________________________________________   PROCEDURES   Procedure(s) performed (including Critical Care):  Procedures   ____________________________________________   INITIAL IMPRESSION / ASSESSMENT AND PLAN / ED COURSE  As part of my medical decision making, I reviewed the following data within the [place]ader was evaluated in Emergency Department on 01/09/2019 for the symptoms described in the history of present illness. She was evaluated in the context of the global COVID-19 pandemic, which necessitated consideration that the patient might be at risk for infection with the SARS-CoV-2 virus that causes COVID-19. Institutional protocols and algorithms that pertain to the evaluation of patients at risk for COVID-19 are in a state of rapid change based on information released by regulatory bodies including the CDC and federal and state organizations. These policies and algorithms were followed during the patient's care in the ED.  Patient presents with acute onset of right upper eyelid swelling upon a.m. awakening.  Patient denies vision loss.  Patient is very anxious.  Patient given discharge care instruction advised take medication as directed.  Patient advised follow-up with ophthalmologist if no improvement or worsening complaint in the next 2 days.  Patient given a work note for 2 days.     ____________________________________________   FINAL CLINICAL IMPRESSION(S) / ED DIAGNOSES  Final diagnoses:  Blepharitis of right upper eyelid, unspecified type     ED Discharge Orders         Ordered    hydrOXYzine (ATARAX/VISTARIL) 50 MG tablet  3 times daily PRN     01/09/19 1215    methylPREDNISolone (MEDROL DOSEPAK) 4 MG TBPK tablet     01/09/19 1215    tobramycin (TOBREX) 0.3 % ophthalmic solution  Every 4 hours     01/09/19 1215           Note:  This document was prepared using Dragon voice recognition software and may include unintentional dictation errors.    Sable Feil, PA-C  01/09/19 1221    Harvest Dark, MD 01/09/19 [phone]

## 2019-01-09 NOTE — ED Notes (Signed)
Left eye 20/50 right eye 20/40

## 2019-01-09 NOTE — ED Triage Notes (Signed)
Right eye swelling and drainage upon awakening this AM.

## 2019-01-09 NOTE — Discharge Instructions (Signed)
Follow discharge care instructions include warm compresses to area.  Follow-up with ophthalmology if no improvement in 2 days.

## 2019-01-16 ENCOUNTER — Encounter: Payer: Self-pay | Admitting: Physician Assistant

## 2019-01-19 ENCOUNTER — Other Ambulatory Visit: Payer: Self-pay

## 2019-01-19 ENCOUNTER — Encounter: Payer: Self-pay | Admitting: Emergency Medicine

## 2019-01-19 ENCOUNTER — Inpatient Hospital Stay
Admission: EM | Admit: 2019-01-19 | Discharge: 2019-01-22 | DRG: 872 | Disposition: A | Discharge status: Home / Self Care | Payer: Self-pay | Attending: Internal Medicine | Admitting: Internal Medicine

## 2019-01-19 DIAGNOSIS — E876 Hypokalemia: Secondary | ICD-10-CM

## 2019-01-19 DIAGNOSIS — F17210 Nicotine dependence, cigarettes, uncomplicated: Secondary | ICD-10-CM | POA: Diagnosis present

## 2019-01-19 DIAGNOSIS — N12 Tubulo-interstitial nephritis, not specified as acute or chronic: Secondary | ICD-10-CM

## 2019-01-19 DIAGNOSIS — Z1159 Encounter for screening for other viral diseases: Secondary | ICD-10-CM

## 2019-01-19 DIAGNOSIS — F1010 Alcohol abuse, uncomplicated: Secondary | ICD-10-CM | POA: Diagnosis present

## 2019-01-19 DIAGNOSIS — N10 Acute tubulo-interstitial nephritis: Secondary | ICD-10-CM | POA: Diagnosis present

## 2019-01-19 DIAGNOSIS — A419 Sepsis, unspecified organism: Principal | ICD-10-CM

## 2019-01-19 DIAGNOSIS — Z8249 Family history of ischemic heart disease and other diseases of the circulatory system: Secondary | ICD-10-CM

## 2019-01-19 LAB — CBC
HCT: 38.9 % (ref 36.0–46.0)
Hemoglobin: 13 g/dL (ref 12.0–15.0)
MCH: 27.8 pg (ref 26.0–34.0)
MCHC: 33.4 g/dL (ref 30.0–36.0)
MCV: 83.3 fL (ref 80.0–100.0)
Platelets: 201 10*3/uL (ref 150–400)
RBC: 4.67 MIL/uL (ref 3.87–5.11)
RDW: 15.7 % — ABNORMAL HIGH (ref 11.5–15.5)
WBC: 22.4 10*3/uL — ABNORMAL HIGH (ref 4.0–10.5)
nRBC: 0 % (ref 0.0–0.2)

## 2019-01-19 LAB — COMPREHENSIVE METABOLIC PANEL
ALT: 14 U/L (ref 0–44)
AST: 17 U/L (ref 15–41)
Albumin: 3.3 g/dL — ABNORMAL LOW (ref 3.5–5.0)
Alkaline Phosphatase: 88 U/L (ref 38–126)
Anion gap: 12 (ref 5–15)
BUN: 10 mg/dL (ref 6–20)
CO2: 23 mmol/L (ref 22–32)
Calcium: 8.5 mg/dL — ABNORMAL LOW (ref 8.9–10.3)
Chloride: 98 mmol/L (ref 98–111)
Creatinine, Ser: 0.98 mg/dL (ref 0.44–1.00)
GFR calc Af Amer: >60 mL/min (ref >60)
GFR calc non Af Amer: >60 mL/min (ref >60)
Glucose, Bld: 167 mg/dL — ABNORMAL HIGH (ref 70–99)
Potassium: 2.4 mmol/L — CL (ref 3.5–5.1)
Sodium: 133 mmol/L — ABNORMAL LOW (ref 135–145)
Total Bilirubin: 0.5 mg/dL (ref 0.3–1.2)
Total Protein: 7.2 g/dL (ref 6.5–8.1)

## 2019-01-19 LAB — URINALYSIS, COMPLETE (UACMP) WITH MICROSCOPIC
Bilirubin Urine: NEGATIVE
Glucose, UA: NEGATIVE mg/dL
Ketones, ur: NEGATIVE mg/dL
Nitrite: NEGATIVE
Protein, ur: 100 mg/dL — AB
Specific Gravity, Urine: 1.009 (ref 1.005–1.030)
WBC, UA: >50 WBC/hpf — ABNORMAL HIGH (ref 0–5)
pH: 6 (ref 5.0–8.0)

## 2019-01-19 LAB — POCT PREGNANCY, URINE: Preg Test, Ur: NEGATIVE

## 2019-01-19 MED ORDER — SODIUM CHLORIDE 0.9 % IV SOLN
1.0000 g | Freq: Once | INTRAVENOUS | Status: AC
  Administered 2019-01-20: 1 g via INTRAVENOUS
  Filled 2019-01-19: qty 10

## 2019-01-19 MED ORDER — KETOROLAC TROMETHAMINE 30 MG/ML IJ SOLN
INTRAMUSCULAR | Status: AC
  Filled 2019-01-19: qty 1

## 2019-01-19 MED ORDER — KETOROLAC TROMETHAMINE 30 MG/ML IJ SOLN: 30.0000 mg | Freq: Once | INTRAMUSCULAR | Status: DC

## 2019-01-19 MED ORDER — SODIUM CHLORIDE 0.9 % IV BOLUS
1000.0000 mL | Freq: Once | INTRAVENOUS | Status: AC
  Administered 2019-01-19: 1000 mL via INTRAVENOUS

## 2019-01-19 MED ORDER — ACETAMINOPHEN 500 MG PO TABS
ORAL_TABLET | ORAL | Status: AC
  Administered 2019-01-19: 23:00:00 1000 mg via ORAL
  Filled 2019-01-19: qty 2

## 2019-01-19 MED ORDER — ACETAMINOPHEN 500 MG PO TABS
1000.0000 mg | ORAL_TABLET | Freq: Once | ORAL | Status: AC
  Administered 2019-01-19: 1000 mg via ORAL

## 2019-01-19 NOTE — ED Triage Notes (Signed)
Patient ambulatory to triage with steady gait, without difficulty, tearful, mask in place;  c/o lower back pain & urinary frequency x wk, body aches, tremors; drink approx pint every day but no alcohol since Sunday

## 2019-01-19 NOTE — ED Provider Notes (Signed)
[facility] Emergency Department Provider Note   First MD Initiated Contact with Patient 01/19/19 2322     (approximate)  I have reviewed the triage vital signs and the nursing notes.   HISTORY  Chief Complaint Urinary Frequency   HPI Jasmine MarkerDanica L Shaikh is a 38 y.o. female presents to the emergency department with 1 week history of dysuria urinary urgency generalized body aches back pain chills and subjective fevers.  Patient noted to be febrile on arrival with a temperature 102.1.  In addition patient admits to history of heavy alcohol use daily but that she stopped drinking on Sunday voluntarily.  Patient denies any cough no shortness of breath.  Patient denies any known sick contact.       Past Medical History:  Diagnosis Date   Herpes     There are no active problems to display for this patient.   History reviewed. No pertinent surgical history.  Prior to Admission medications   Medication Sig Start Date End Date Taking? Authorizing Provider  hydrOXYzine (ATARAX/VISTARIL) 50 MG tablet Take 1 tablet (50 mg total) by mouth 3 (three) times daily as needed for itching. 01/09/19   Joni ReiningSmith, Ronald K, PA-C  methylPREDNISolone (MEDROL DOSEPAK) 4 MG TBPK tablet Take Tapered dose as directed 01/09/19   Joni ReiningSmith, Ronald K, PA-C    Allergies Patient has no known allergies.  No family history on file.  Social History Social History   Tobacco Use   Smoking status: Current Every Day Smoker    Types: Cigarettes   Smokeless tobacco: Never Used  Substance Use Topics   Alcohol use: Yes    Comment: drinks daily, pint   Drug use: Not Currently    Review of Systems Constitutional: Positive for fever/chills Eyes: No visual changes. ENT: No sore throat. Cardiovascular: Denies chest pain. Respiratory: Denies shortness of breath. Gastrointestinal: Positive for abdominal/back pain. Genitourinary: Positive for dysuria and urinary frequency Musculoskeletal:  Negative for neck pain.  Negative for back pain. Integumentary: Negative for rash. Neurological: Negative for headaches, focal weakness or numbness.  ____________________________________________   PHYSICAL EXAM:  VITAL SIGNS: ED Triage Vitals  Enc Vitals Group     BP 01/19/19 2248 113/70     Pulse Rate 01/19/19 2248 (!) 140     Resp 01/19/19 2248 20     Temp 01/19/19 2248 (!) 102.1 F (38.9 C)     Temp Source 01/19/19 2248 Oral     SpO2 01/19/19 2248 100 %     Weight 01/19/19 2251 68 kg (150 lb)     Height 01/19/19 2251 1.753 m (5\' 9" )     Head Circumference --      Peak Flow --      Pain Score 01/19/19 2251 5     Pain Loc --      Pain Edu? --      Excl. in GC? --     Constitutional: Alert and oriented. Well appearing and in no acute distress. Eyes: Conjunctivae are normal. Mouth/Throat: Mucous membranes are moist.  Oropharynx non-erythematous. Neck: No stridor.   Cardiovascular: Normal rate, regular rhythm. Good peripheral circulation. Grossly normal heart sounds. Respiratory: Normal respiratory effort.  No retractions. No audible wheezing. Gastrointestinal: Soft and nontender. No distention.  Bilateral CVA tenderness musculoskeletal: No lower extremity tenderness nor edema. No gross deformities of extremities. Neurologic:  Normal speech and language. No gross focal neurologic deficits are appreciated.  Skin:  Skin is warm, dry and intact. No rash noted. Psychiatric: Mood  and affect are normal. Speech and behavior are normal.  ____________________________________________   LABS (all labs ordered are listed, but only abnormal results are displayed)  Labs Reviewed  URINALYSIS, COMPLETE (UACMP) WITH MICROSCOPIC - Abnormal; Notable for the following components:      Result Value   Color, Urine YELLOW (*)    APPearance HAZY (*)    Hgb urine dipstick MODERATE (*)    Protein, ur 100 (*)    Leukocytes,Ua SMALL (*)    WBC, UA >50 (*)    Bacteria, UA RARE (*)    All  other components within normal limits  CBC - Abnormal; Notable for the following components:   WBC 22.4 (*)    RDW 15.7 (*)    All other components within normal limits  COMPREHENSIVE METABOLIC PANEL - Abnormal; Notable for the following components:   Sodium 133 (*)    Potassium 2.4 (*)    Glucose, Bld 167 (*)    Calcium 8.5 (*)    Albumin 3.3 (*)    All other components within normal limits  SARS CORONAVIRUS 2 (HOSPITAL ORDER, PERFORMED IN [facility])  CULTURE, BLOOD (ROUTINE X 2)  CULTURE, BLOOD (ROUTINE X 2)  URINE CULTURE  APTT  PROTIME-INR  LACTIC ACID, PLASMA  POCT PREGNANCY, URINE  POC URINE PREG, ED   ____________________________________________  EKG  ED ECG REPORT I, Hugo N Lucius Wise, the attending physician, personally viewed and interpreted this ECG.   Date: 01/19/2019  EKG Time: 11:03 PM  Rate: 106  Rhythm: Sinus tachycardia  Axis: Normal  Intervals: Normal  ST&T Change: None  ____________________________________________  RADIOLOGY I, [place] N Lorian Yaun, personally viewed and evaluated these images (plain radiographs) as part of my medical decision making, as well as reviewing the written report by the radiologist.  ED MD interpretation: Heterogeneous striated nephrogram bilaterally suspicious for acute pyelonephritis on CT abdomen pelvis per radiologist.  Official radiology report(s): Ct Abdomen Pelvis W Contrast  Result Date: 01/20/2019 CLINICAL DATA:  Initial evaluation for acute generalized abdominal pain. UTI, fever, back pain. EXAM: CT ABDOMEN AND PELVIS WITH CONTRAST TECHNIQUE: Multidetector CT imaging of the abdomen and pelvis was performed using the standard protocol following bolus administration of intravenous contrast. CONTRAST:  100mL OMNIPAQUE IOHEXOL 300 MG/ML  SOLN COMPARISON:  None. FINDINGS: Lower chest: Visualized lung bases are clear. Hepatobiliary: Liver demonstrates a normal contrast enhanced appearance. Gallbladder within  normal limits. No biliary dilatation. Pancreas: Pancreas within normal limits. Spleen: Spleen within normal limits. Adrenals/Urinary Tract: Adrenal glands are normal. Kidneys demonstrate a heterogeneous and somewhat mottled, striated nephrogram bilaterally, suspicious for acute pyelonephritis. Left kidney slightly enlarged as compared to the. Mild bilateral perinephric fat stranding. No nephrolithiasis or hydronephrosis. No focal enhancing renal mass. No visible hydroureter. Partially distended bladder within normal limits. Stomach/Bowel: Stomach within normal limits. No evidence for bowel obstruction. No acute inflammatory changes seen about the bowels. No evidence for acute appendicitis. Vascular/Lymphatic: Normal intravascular enhancement seen throughout the intra-abdominal aorta. Mesenteric vessels patent proximally. No adenopathy. Reproductive: Uterus and ovaries within normal limits for age. Other: No free air or fluid. Musculoskeletal: No acute osseous finding. No discrete lytic or blastic osseous lesions. IMPRESSION: 1. Heterogeneous striated nephrogram bilaterally, suspicious for acute pyelonephritis. Correlation with urinalysis recommended. 2. No other acute intra-abdominal or pelvic process identified. Electronically Signed   By: Rise MuBenjamin  McClintock M.D.   On: 01/20/2019 01:29   Dg Chest Port 1 View  Result Date: 01/20/2019 CLINICAL DATA:  Initial evaluation for acute sepsis.  EXAM: PORTABLE CHEST 1 VIEW COMPARISON:  Prior radiograph from 08/09/2010 FINDINGS: Cardiac and mediastinal silhouettes are stable in size and contour, and remain within normal limits. Lungs are hyperinflated with underlying emphysematous changes. No focal infiltrates. No edema or effusion. No pneumothorax. No acute osseous finding. IMPRESSION: 1. Hyperinflation with underlying emphysema. 2. No other active cardiopulmonary disease. Electronically Signed   By: Rise MuBenjamin  McClintock M.D.   On: 01/20/2019 00:43     ____________________________________________    .Critical Care Performed by: Darci CurrentBrown, [place] N, MD Authorized by: Darci CurrentBrown, [place] N, MD   Critical care provider statement:    Critical care time (minutes):  30   Critical care time was exclusive of:  Separately billable procedures and treating other patients   Critical care was necessary to treat or prevent imminent or life-threatening deterioration of the following conditions:  Sepsis   Critical care was time spent personally by me on the following activities:  Development of treatment plan with patient or surrogate, discussions with consultants, evaluation of patient's response to treatment, examination of patient, obtaining history from patient or surrogate, ordering and performing treatments and interventions, ordering and review of laboratory studies, ordering and review of radiographic studies, pulse oximetry, re-evaluation of patient's condition and review of old charts     ____________________________________________   INITIAL IMPRESSION / MDM / ASSESSMENT AND PLAN / ED COURSE  As part of my medical decision making, I reviewed the following data within the electronic MEDICAL RECORD NUMBER 38 year old female presenting with above-stated history and physical exam that meets sepsis criteria as patient is tachycardic febrile leukocytosis of 22.  Suspect pyelonephritis as etiology for the patient's symptoms.  Urinalysis revealed evidence of UTI.  CT scan revealed evidence of bilateral pyelonephritis.  Patient given IV ceftriaxone in the emergency department.  Patient also noted to be hypokalemic with a potassium of 2.4.  Patient given 40 mEq of p.o. potassium as well as potassium chloride 10 mg IV hour x4 hours.      ____________________________________________  FINAL CLINICAL IMPRESSION(S) / ED DIAGNOSES  Final diagnoses:  Pyelonephritis  Sepsis, due to unspecified organism, unspecified whether acute organ dysfunction present  (HCC)  Hypokalemia     MEDICATIONS GIVEN DURING THIS VISIT:  Medications  ketorolac (TORADOL) 30 MG/ML injection 30 mg (0 mg Intravenous Hold 01/20/19 0115)  potassium chloride 10 mEq in 100 mL IVPB (10 mEq Intravenous New Bag/Given 01/20/19 0113)  acetaminophen (TYLENOL) tablet 1,000 mg (1,000 mg Oral Given 01/19/19 2300)  sodium chloride 0.9 % bolus 1,000 mL (0 mLs Intravenous Stopped 01/20/19 0055)  cefTRIAXone (ROCEPHIN) 1 g in sodium chloride 0.9 % 100 mL IVPB (0 g Intravenous Stopped 01/20/19 0051)  potassium chloride SA (K-DUR) CR tablet 40 mEq (40 mEq Oral Given 01/20/19 0114)  iohexol (OMNIPAQUE) 300 MG/ML solution 100 mL (100 mLs Intravenous Contrast Given 01/20/19 0050)     ED Discharge Orders    None      *Please note:  Jasmine Blankenship was evaluated in Emergency Department on 01/20/2019 for the symptoms described in the history of present illness. She was evaluated in the context of the global COVID-19 pandemic, which necessitated consideration that the patient might be at risk for infection with the SARS-CoV-2 virus that causes COVID-19. Institutional protocols and algorithms that pertain to the evaluation of patients at risk for COVID-19 are in a state of rapid change based on information released by regulatory bodies including the CDC and federal and state organizations. These policies and algorithms were followed during  the patient's care in the ED.  Some ED evaluations and interventions may be delayed as a result of limited staffing during the pandemic.*  Note:  This document was prepared using Dragon voice recognition software and may include unintentional dictation errors.   Gregor Hams, MD 01/20/19 [phone]

## 2019-01-20 ENCOUNTER — Emergency Department: Payer: Self-pay

## 2019-01-20 ENCOUNTER — Encounter: Payer: Self-pay | Admitting: Radiology

## 2019-01-20 DIAGNOSIS — N10 Acute tubulo-interstitial nephritis: Secondary | ICD-10-CM | POA: Diagnosis present

## 2019-01-20 LAB — PROTIME-INR
INR: 1.2 (ref 0.8–1.2)
Prothrombin Time: 15 seconds (ref 11.4–15.2)

## 2019-01-20 LAB — BASIC METABOLIC PANEL
Anion gap: 9 (ref 5–15)
BUN: 8 mg/dL (ref 6–20)
CO2: 23 mmol/L (ref 22–32)
Calcium: 7.9 mg/dL — ABNORMAL LOW (ref 8.9–10.3)
Chloride: 106 mmol/L (ref 98–111)
Creatinine, Ser: 0.95 mg/dL (ref 0.44–1.00)
GFR calc Af Amer: >60 mL/min (ref >60)
GFR calc non Af Amer: >60 mL/min (ref >60)
Glucose, Bld: 162 mg/dL — ABNORMAL HIGH (ref 70–99)
Potassium: 4 mmol/L (ref 3.5–5.1)
Sodium: 138 mmol/L (ref 135–145)

## 2019-01-20 LAB — CBC
HCT: 37.8 % (ref 36.0–46.0)
Hemoglobin: 12 g/dL (ref 12.0–15.0)
MCH: 27.5 pg (ref 26.0–34.0)
MCHC: 31.7 g/dL (ref 30.0–36.0)
MCV: 86.5 fL (ref 80.0–100.0)
Platelets: 190 10*3/uL (ref 150–400)
RBC: 4.37 MIL/uL (ref 3.87–5.11)
RDW: 15.9 % — ABNORMAL HIGH (ref 11.5–15.5)
WBC: 21.3 10*3/uL — ABNORMAL HIGH (ref 4.0–10.5)
nRBC: 0 % (ref 0.0–0.2)

## 2019-01-20 LAB — LACTIC ACID, PLASMA: Lactic Acid, Venous: 1.2 mmol/L (ref 0.5–1.9)

## 2019-01-20 LAB — APTT: aPTT: 31 seconds (ref 24–36)

## 2019-01-20 LAB — SARS CORONAVIRUS 2 BY RT PCR (HOSPITAL ORDER, PERFORMED IN ~~LOC~~ HOSPITAL LAB): SARS Coronavirus 2: NEGATIVE

## 2019-01-20 LAB — MAGNESIUM: Magnesium: 1.7 mg/dL (ref 1.7–2.4)

## 2019-01-20 MED ORDER — HYDROXYZINE HCL 25 MG PO TABS: 50.0000 mg | ORAL_TABLET | Freq: Three times a day (TID) | ORAL | Status: DC | PRN

## 2019-01-20 MED ORDER — POTASSIUM CHLORIDE 10 MEQ/100ML IV SOLN
10.0000 meq | INTRAVENOUS | Status: AC
  Administered 2019-01-20 (×4): 10 meq via INTRAVENOUS
  Filled 2019-01-20 (×4): qty 100

## 2019-01-20 MED ORDER — ENSURE ENLIVE PO LIQD
237.0000 mL | Freq: Two times a day (BID) | ORAL | Status: DC
  Administered 2019-01-20 – 2019-01-22 (×5): 237 mL via ORAL

## 2019-01-20 MED ORDER — NICOTINE 21 MG/24HR TD PT24
21.0000 mg | MEDICATED_PATCH | Freq: Every day | TRANSDERMAL | Status: DC
  Administered 2019-01-20 – 2019-01-22 (×3): 21 mg via TRANSDERMAL
  Filled 2019-01-20 (×3): qty 1

## 2019-01-20 MED ORDER — POTASSIUM CHLORIDE 20 MEQ PO PACK
40.0000 meq | PACK | Freq: Once | ORAL | Status: AC
  Administered 2019-01-20: 06:00:00 40 meq via ORAL
  Filled 2019-01-20: qty 2

## 2019-01-20 MED ORDER — OXYCODONE HCL 5 MG PO TABS
5.0000 mg | ORAL_TABLET | ORAL | Status: DC | PRN
  Administered 2019-01-20: 5 mg via ORAL
  Filled 2019-01-20: qty 1

## 2019-01-20 MED ORDER — TOBRAMYCIN 0.3 % OP SOLN
1.0000 [drp] | OPHTHALMIC | Status: DC
  Administered 2019-01-20 (×4): 1 [drp] via OPHTHALMIC
  Filled 2019-01-20: qty 5

## 2019-01-20 MED ORDER — ADULT MULTIVITAMIN W/MINERALS CH
1.0000 | ORAL_TABLET | Freq: Every day | ORAL | Status: DC
  Administered 2019-01-21 – 2019-01-22 (×2): 1 via ORAL
  Filled 2019-01-20 (×2): qty 1

## 2019-01-20 MED ORDER — LORAZEPAM 2 MG/ML IJ SOLN
1.0000 mg | INTRAMUSCULAR | Status: DC | PRN
  Administered 2019-01-22: 02:00:00 1 mg via INTRAVENOUS
  Filled 2019-01-20: qty 1

## 2019-01-20 MED ORDER — KETOROLAC TROMETHAMINE 30 MG/ML IJ SOLN
15.0000 mg | Freq: Four times a day (QID) | INTRAMUSCULAR | Status: DC | PRN
  Administered 2019-01-20 (×2): 15 mg via INTRAVENOUS
  Filled 2019-01-20 (×2): qty 1

## 2019-01-20 MED ORDER — IOHEXOL 300 MG/ML  SOLN
100.0000 mL | Freq: Once | INTRAMUSCULAR | Status: AC | PRN
  Administered 2019-01-20: 01:00:00 100 mL via INTRAVENOUS

## 2019-01-20 MED ORDER — ONDANSETRON HCL 4 MG PO TABS: 4.0000 mg | ORAL_TABLET | Freq: Four times a day (QID) | ORAL | Status: DC | PRN

## 2019-01-20 MED ORDER — THIAMINE HCL 100 MG/ML IJ SOLN
Freq: Once | INTRAVENOUS | Status: AC
  Administered 2019-01-20: 09:00:00 via INTRAVENOUS
  Filled 2019-01-20: qty 1000

## 2019-01-20 MED ORDER — MAGNESIUM HYDROXIDE 400 MG/5ML PO SUSP: 30.0000 mL | Freq: Every day | ORAL | Status: DC | PRN

## 2019-01-20 MED ORDER — POTASSIUM CHLORIDE CRYS ER 20 MEQ PO TBCR
40.0000 meq | EXTENDED_RELEASE_TABLET | Freq: Once | ORAL | Status: AC
  Administered 2019-01-20: 01:00:00 40 meq via ORAL
  Filled 2019-01-20: qty 2

## 2019-01-20 MED ORDER — ONDANSETRON HCL 4 MG/2ML IJ SOLN
4.0000 mg | Freq: Four times a day (QID) | INTRAMUSCULAR | Status: DC | PRN
  Administered 2019-01-20: 09:00:00 4 mg via INTRAVENOUS
  Filled 2019-01-20: qty 2

## 2019-01-20 MED ORDER — LORAZEPAM BOLUS VIA INFUSION: 1.0000 mg | INTRAVENOUS | Status: DC | PRN

## 2019-01-20 MED ORDER — ACETAMINOPHEN 325 MG PO TABS
650.0000 mg | ORAL_TABLET | Freq: Four times a day (QID) | ORAL | Status: DC | PRN
  Administered 2019-01-20 – 2019-01-22 (×5): 650 mg via ORAL
  Filled 2019-01-20 (×5): qty 2

## 2019-01-20 MED ORDER — TRAZODONE HCL 50 MG PO TABS: 25.0000 mg | ORAL_TABLET | Freq: Every evening | ORAL | Status: DC | PRN

## 2019-01-20 MED ORDER — SODIUM CHLORIDE 0.9 % IV BOLUS
500.0000 mL | Freq: Once | INTRAVENOUS | Status: AC
  Administered 2019-01-20: 16:00:00 500 mL via INTRAVENOUS

## 2019-01-20 MED ORDER — ACETAMINOPHEN 650 MG RE SUPP: 650.0000 mg | Freq: Four times a day (QID) | RECTAL | Status: DC | PRN

## 2019-01-20 MED ORDER — SODIUM CHLORIDE 0.9 % IV SOLN
1.0000 g | INTRAVENOUS | Status: DC
  Administered 2019-01-20 – 2019-01-21 (×2): 1 g via INTRAVENOUS
  Filled 2019-01-20 (×2): qty 10
  Filled 2019-01-20: qty 1

## 2019-01-20 MED ORDER — ENOXAPARIN SODIUM 40 MG/0.4ML ~~LOC~~ SOLN
40.0000 mg | SUBCUTANEOUS | Status: DC
  Administered 2019-01-20 – 2019-01-21 (×2): 40 mg via SUBCUTANEOUS
  Filled 2019-01-20 (×2): qty 0.4

## 2019-01-20 MED ORDER — SODIUM CHLORIDE 0.9 % IV SOLN
INTRAVENOUS | Status: DC
  Administered 2019-01-20 – 2019-01-21 (×3): via INTRAVENOUS

## 2019-01-20 NOTE — Progress Notes (Signed)
CODE SEPSIS - PHARMACY COMMUNICATION  **Broad Spectrum Antibiotics should be administered within 1 hour of Sepsis diagnosis**  Time Code Sepsis Called/Page Received: 2358  Antibiotics Ordered: ceftriaxone  Time of 1st antibiotic administration: 0001  Additional action taken by pharmacy:   If necessary, Name of Provider/Nurse Contacted:     Tobie Lords ,PharmD Clinical Pharmacist  01/20/2019  12:12 AM

## 2019-01-20 NOTE — ED Notes (Signed)
Report called to [facility], pt admitted to [facility]

## 2019-01-20 NOTE — ED Notes (Signed)
Lab called to check status of lactic acid

## 2019-01-20 NOTE — Progress Notes (Signed)
[facility] [employer] NAME: Jasmine Blankenship    MR#:  [id number]  DATE OF BIRTH:  15-Jul-1980  SUBJECTIVE:   She admitted to the hospital secondary to urinary frequency and dysuria and noted to have urinary tract infection with acute pyelonephritis.  Patient says she feels a lot better after being started on fluids and antibiotics.  REVIEW OF SYSTEMS:    Review of Systems  Constitutional: Negative for chills and fever.  HENT: Negative for congestion and tinnitus.   Eyes: Negative for blurred vision and double vision.  Respiratory: Negative for cough, shortness of breath and wheezing.   Cardiovascular: Negative for chest pain, orthopnea and PND.  Gastrointestinal: Positive for abdominal pain and nausea. Negative for diarrhea and vomiting.  Genitourinary: Negative for dysuria and hematuria.  Neurological: Negative for dizziness, sensory change and focal weakness.  All other systems reviewed and are negative.   Nutrition: Regular Tolerating Diet: Yes Tolerating PT: Await Eval.    DRUG ALLERGIES:  No Known Allergies  VITALS:  Blood pressure (!) 86/52, pulse 83, temperature 99.3 F (37.4 C), temperature source Oral, resp. rate 18, height 5\' 10"  (1.778 m), weight 60.2 kg, SpO2 99 %.  PHYSICAL EXAMINATION:   Physical Exam  GENERAL:  38 y.o.-year-old patient lying in bed in no acute distress.  EYES: Pupils equal, round, reactive to light and accommodation. No scleral icterus. Extraocular muscles intact.  HEENT: Head atraumatic, normocephalic. Oropharynx and nasopharynx clear.  NECK:  Supple, no jugular venous distention. No thyroid enlargement, no tenderness.  LUNGS: Normal breath sounds bilaterally, no wheezing, rales, rhonchi. No use of accessory muscles of respiration.  CARDIOVASCULAR: S1, S2 normal. No murmurs, rubs, or gallops.  ABDOMEN: Soft, nontender, nondistended. Bowel sounds present. No organomegaly or mass.  EXTREMITIES: No  cyanosis, clubbing or edema b/l.    NEUROLOGIC: Cranial nerves II through XII are intact. No focal Motor or sensory deficits b/l.   PSYCHIATRIC: The patient is alert and oriented x 3.  SKIN: No obvious rash, lesion, or ulcer.    LABORATORY PANEL:   CBC Recent Labs  Lab 01/20/19 0651  WBC 21.3*  HGB 12.0  HCT 37.8  PLT 190   ------------------------------------------------------------------------------------------------------------------  Chemistries  Recent Labs  Lab 01/19/19 2313 01/20/19 0651  NA 133* 138  K 2.4* 4.0  CL 98 106  CO2 23 23  GLUCOSE 167* 162*  BUN 10 8  CREATININE 0.98 0.95  CALCIUM 8.5* 7.9*  MG  --  1.7  AST 17  --   ALT 14  --   ALKPHOS 88  --   BILITOT 0.5  --    ------------------------------------------------------------------------------------------------------------------  Cardiac Enzymes No results for input(s): TROPONINI in the last 168 hours. ------------------------------------------------------------------------------------------------------------------  RADIOLOGY:  Ct Abdomen Pelvis W Contrast  Result Date: 01/20/2019 CLINICAL DATA:  Initial evaluation for acute generalized abdominal pain. UTI, fever, back pain. EXAM: CT ABDOMEN AND PELVIS WITH CONTRAST TECHNIQUE: Multidetector CT imaging of the abdomen and pelvis was performed using the standard protocol following bolus administration of intravenous contrast. CONTRAST:  182mL OMNIPAQUE IOHEXOL 300 MG/ML  SOLN COMPARISON:  None. FINDINGS: Lower chest: Visualized lung bases are clear. Hepatobiliary: Liver demonstrates a normal contrast enhanced appearance. Gallbladder within normal limits. No biliary dilatation. Pancreas: Pancreas within normal limits. Spleen: Spleen within normal limits. Adrenals/Urinary Tract: Adrenal glands are normal. Kidneys demonstrate a heterogeneous and somewhat mottled, striated nephrogram bilaterally, suspicious for acute pyelonephritis. Left kidney slightly  enlarged as compared to  the. Mild bilateral perinephric fat stranding. No nephrolithiasis or hydronephrosis. No focal enhancing renal mass. No visible hydroureter. Partially distended bladder within normal limits. Stomach/Bowel: Stomach within normal limits. No evidence for bowel obstruction. No acute inflammatory changes seen about the bowels. No evidence for acute appendicitis. Vascular/Lymphatic: Normal intravascular enhancement seen throughout the intra-abdominal aorta. Mesenteric vessels patent proximally. No adenopathy. Reproductive: Uterus and ovaries within normal limits for age. Other: No free air or fluid. Musculoskeletal: No acute osseous finding. No discrete lytic or blastic osseous lesions. IMPRESSION: 1. Heterogeneous striated nephrogram bilaterally, suspicious for acute pyelonephritis. Correlation with urinalysis recommended. 2. No other acute intra-abdominal or pelvic process identified. Electronically Signed   By: Rise MuBenjamin  McClintock M.D.   On: 01/20/2019 01:29   Dg Chest Port 1 View  Result Date: 01/20/2019 CLINICAL DATA:  Initial evaluation for acute sepsis. EXAM: PORTABLE CHEST 1 VIEW COMPARISON:  Prior radiograph from 08/09/2010 FINDINGS: Cardiac and mediastinal silhouettes are stable in size and contour, and remain within normal limits. Lungs are hyperinflated with underlying emphysematous changes. No focal infiltrates. No edema or effusion. No pneumothorax. No acute osseous finding. IMPRESSION: 1. Hyperinflation with underlying emphysema. 2. No other active cardiopulmonary disease. Electronically Signed   By: Rise MuBenjamin  McClintock M.D.   On: 01/20/2019 00:43     ASSESSMENT AND PLAN:   38 year old female with no significant past medical history presents to the hospital due to dysuria, urinary frequency and noted to have sepsis secondary to acute pyelonephritis.  1.  Sepsis-patient meets criteria given her leukocytosis, fever urinalysis that is positive and CT scan suggestive of  pyelonephritis. -Continue IV fluids, IV ceftriaxone.  Follow cultures.  Clinically improving.  2.  Acute pyelonephritis-source of patient's sepsis. -Continue IV fluids, pain control, antiemetics. -Continue IV ceftriaxone, follow cultures.  3.  Leukocytosis-secondary to the pyelonephritis. - Follow with IV antibiotic therapy.  4.  Hypokalemia-improved and resolved with supplementation.  5.  Alcohol abuse-patient is high risk for alcohol withdrawal. -Continue thiamine, folate.  Continue Ativan as needed.   All the records are reviewed and case discussed with Care Management/Social Worker. Management plans discussed with the patient, family and they are in agreement.  CODE STATUS: Full code  DVT Prophylaxis: Lovenox  TOTAL TIME TAKING CARE OF THIS PATIENT: 30 minutes.   POSSIBLE D/C IN 1-2 DAYS, DEPENDING ON CLINICAL CONDITION.   Houston SirenVivek J Lavana Huckeba M.D on 01/20/2019 at 2:58 PM  Between 7am to 6pm - Pager - [phone]  After 6pm go to [email or web address] - Social research officer, governmentpassword [email or web address] [email or web address]  [facility]  Office  [phone]  CC: Primary care physician; Patient, No Pcp Per

## 2019-01-20 NOTE — Progress Notes (Signed)
Initial Nutrition Assessment  DOCUMENTATION CODES:   Not applicable  INTERVENTION:   Ensure Enlive po BID, each supplement provides 350 kcal and 20 grams of protein  Recommend MVI, thiamine and folic acid in setting of etoh abuse  Liberalize diet   Pt likely at high refeed risk; recommend monitor K, Mg and P labs daily as oral intake improves.   NUTRITION DIAGNOSIS:   Inadequate oral intake related to acute illness as evidenced by meal completion < 25%.  GOAL:   Patient will meet greater than or equal to 90% of their needs  MONITOR:   PO intake, Supplement acceptance, Labs, Weight trends, Skin, I & O's  REASON FOR ASSESSMENT:   Malnutrition Screening Tool    ASSESSMENT:   38 y.o. female with a known history of ongoing tobacco and etoh abuse presented to the emergency room with acute onset of bilateral flank pain, urinary frequency and urgency as well as myalgia. Pt found to have acute pyleonephritis and sepsis  RD working remotely.  Pt with poor appetite and oral intake in hospital; pt eating 10% of meals. RD suspects pt with poor oral intake at baseline r/t etoh abuse. Per chart, pt with 12lb(9%) weight loss over the past 2 weeks; suspect some weight loss r/t dehydration. RD will add supplements and MVI to help pt meet her estimated needs. RD will also liberalize diet to encourage increased po intake. Pt likely at high refeed risk; recommend monitor K, Mg and P labs daily as oral intake improves.   Medications reviewed and include: lovenox, ceftriaxone, NaCl @100ml /hr   Labs reviewed: K 4.0 wnl, Mg 1.7 wnl Wbc- 21.3(H)  Unable to complete Nutrition-Focused physical exam at this time.   Diet Order:   Diet Order            Diet Heart Room service appropriate? Yes; Fluid consistency: Thin  Diet effective now             EDUCATION NEEDS:   No education needs have been identified at this time  Skin:  Skin Assessment: Reviewed RN Assessment  Last BM:   7/16  Height:   Ht Readings from Last 1 Encounters:  01/20/19 5\' 10"  (1.778 m)    Weight:   Wt Readings from Last 1 Encounters:  01/20/19 60.2 kg    Ideal Body Weight:  68 kg  BMI:  Body mass index is 19.05 kg/m.  Estimated Nutritional Needs:   Kcal:  1800-2100kcal/day  Protein:  90-105g/day  Fluid:  >1.8L/day  Koleen Distance MS, RD, LDN Pager #- [phone] Office#- [phone] After Hours Pager: [phone]

## 2019-01-20 NOTE — H&P (Addendum)
[facility] -  at [facility]   PATIENT NAME: Jasmine DavenportDanica Blankenship    MR#:  [id number]  DATE OF BIRTH:  02-17-1981  DATE OF ADMISSION:  01/19/2019  PRIMARY CARE PHYSICIAN: Patient, No Pcp Per   REQUESTING/REFERRING PHYSICIAN: Bayard MalesBrown, Dawson, MD CHIEF COMPLAINT:   Chief Complaint  Patient presents with   Urinary Frequency    HISTORY OF PRESENT ILLNESS:  Jasmine Blankenship  is a 38 y.o. African-American female with a known history of ongoing tobacco needs attributes, presented to the emergency room with acute onset of bilateral flank pain, urinary frequency and urgency as well as myalgia.  She admitted to associated fever and chills.  She had nausea and vomiting twice today.  No headache or dizziness or blurred vision.  No cough or wheezing or shortness of breath.  No recent sick exposures or exposure to COVID-19.  Upon presentation to the emergency room, temperature was 102.1 with a pulse of 140 and otherwise normal vital signs.  Blood pressure later on was down to 95/68 with a pulse of 76 and 10 she was down to 90 1:08 gram of p.o. Tylenol as well as 30 mg of IV Toradol, 1 g of IV Rocephin and 1 L of IV normal saline bolus..  Labs revealed significant hypokalemia of 2.4 and significant leukocytosis of 22.4.  Lactic acid was 1.2.  COVID-19 test came back negative.  Abdomen pelvis CT scan showed bilateral acute pyelonephritis.  Portable chest ray showed no acute cardiopulmonary disease.  The patient had 2 blood cultures drawn.  She was given medications mentioned above in addition to 40 mEq p.o. potassium chloride as well as 41: IV potassium chloride.  She will be admitted to a medical monitored bed for further evaluation and management. PAST MEDICAL HISTORY:   Past Medical History:  Diagnosis Date   Herpes   Ongoing tobacco abuse and alcohol abuse.  PAST SURGICAL HISTORY:  History reviewed. No pertinent surgical history.  She denies any previous surgeries.  SOCIAL  HISTORY:   Social History   Tobacco Use   Smoking status: Current Every Day Smoker    Types: Cigarettes   Smokeless tobacco: Never Used  Substance Use Topics   Alcohol use: Yes    Comment: drinks daily, pint    FAMILY HISTORY:  Positive for hypertension in his mother.  DRUG ALLERGIES:  No Known Allergies  REVIEW OF SYSTEMS:   ROS As per history of present illness. All pertinent systems were reviewed above. Constitutional,  HEENT, cardiovascular, respiratory, GI, GU, musculoskeletal, neuro, psychiatric, endocrine,  integumentary and hematologic systems were reviewed and are otherwise  negative/unremarkable except for positive findings mentioned above in the HPI.   MEDICATIONS AT HOME:   Prior to Admission medications   Not on File      VITAL SIGNS:  Blood pressure 108/75, pulse 80, temperature (!) 102.1 F (38.9 C), temperature source Oral, resp. rate 17, height 5\' 9"  (1.753 m), weight 68 kg, SpO2 99 %.  PHYSICAL EXAMINATION:  Physical Exam  GENERAL:  38 y.o.-year-old African-American female patient lying in the bed with no acute distress.  EYES: Pupils equal, round, reactive to light and accommodation. No scleral icterus. Extraocular muscles intact.  HEENT: Head atraumatic, normocephalic. Oropharynx and nasopharynx clear.  NECK:  Supple, no jugular venous distention. No thyroid enlargement, no tenderness.  LUNGS: Normal breath sounds bilaterally, no wheezing, rales,rhonchi or crepitation. No use of accessory muscles of respiration.  CARDIOVASCULAR: Regular rate and rhythm, S1, S2 normal. No murmurs,  rubs, or gallops.  ABDOMEN: Soft, nondistended, nontender. Bowel sounds present. No organomegaly or mass.  She has bilateral left more than right CVA tenderness. EXTREMITIES: No pedal edema, cyanosis, or clubbing.  NEUROLOGIC: Cranial nerves II through XII are intact. Muscle strength 5/5 in all extremities. Sensation intact. Gait not checked.  PSYCHIATRIC: The patient  is alert and oriented x 3.  Normal affect and good eye contact. SKIN: No obvious rash, lesion, or ulcer.   LABORATORY PANEL:   CBC Recent Labs  Lab 01/19/19 2313  WBC 22.4*  HGB 13.0  HCT 38.9  PLT 201   ------------------------------------------------------------------------------------------------------------------  Chemistries  Recent Labs  Lab 01/19/19 2313  NA 133*  K 2.4*  CL 98  CO2 23  GLUCOSE 167*  BUN 10  CREATININE 0.98  CALCIUM 8.5*  AST 17  ALT 14  ALKPHOS 88  BILITOT 0.5   ------------------------------------------------------------------------------------------------------------------  Cardiac Enzymes No results for input(s): TROPONINI in the last 168 hours. ------------------------------------------------------------------------------------------------------------------  RADIOLOGY:  Ct Abdomen Pelvis W Contrast  Result Date: 01/20/2019 CLINICAL DATA:  Initial evaluation for acute generalized abdominal pain. UTI, fever, back pain. EXAM: CT ABDOMEN AND PELVIS WITH CONTRAST TECHNIQUE: Multidetector CT imaging of the abdomen and pelvis was performed using the standard protocol following bolus administration of intravenous contrast. CONTRAST:  100mL OMNIPAQUE IOHEXOL 300 MG/ML  SOLN COMPARISON:  None. FINDINGS: Lower chest: Visualized lung bases are clear. Hepatobiliary: Liver demonstrates a normal contrast enhanced appearance. Gallbladder within normal limits. No biliary dilatation. Pancreas: Pancreas within normal limits. Spleen: Spleen within normal limits. Adrenals/Urinary Tract: Adrenal glands are normal. Kidneys demonstrate a heterogeneous and somewhat mottled, striated nephrogram bilaterally, suspicious for acute pyelonephritis. Left kidney slightly enlarged as compared to the. Mild bilateral perinephric fat stranding. No nephrolithiasis or hydronephrosis. No focal enhancing renal mass. No visible hydroureter. Partially distended bladder within normal  limits. Stomach/Bowel: Stomach within normal limits. No evidence for bowel obstruction. No acute inflammatory changes seen about the bowels. No evidence for acute appendicitis. Vascular/Lymphatic: Normal intravascular enhancement seen throughout the intra-abdominal aorta. Mesenteric vessels patent proximally. No adenopathy. Reproductive: Uterus and ovaries within normal limits for age. Other: No free air or fluid. Musculoskeletal: No acute osseous finding. No discrete lytic or blastic osseous lesions. IMPRESSION: 1. Heterogeneous striated nephrogram bilaterally, suspicious for acute pyelonephritis. Correlation with urinalysis recommended. 2. No other acute intra-abdominal or pelvic process identified. Electronically Signed   By: Rise MuBenjamin  McClintock M.D.   On: 01/20/2019 01:29   Dg Chest Port 1 View  Result Date: 01/20/2019 CLINICAL DATA:  Initial evaluation for acute sepsis. EXAM: PORTABLE CHEST 1 VIEW COMPARISON:  Prior radiograph from 08/09/2010 FINDINGS: Cardiac and mediastinal silhouettes are stable in size and contour, and remain within normal limits. Lungs are hyperinflated with underlying emphysematous changes. No focal infiltrates. No edema or effusion. No pneumothorax. No acute osseous finding. IMPRESSION: 1. Hyperinflation with underlying emphysema. 2. No other active cardiopulmonary disease. Electronically Signed   By: Rise MuBenjamin  McClintock M.D.   On: 01/20/2019 00:43      IMPRESSION AND PLAN:   1.  Bilateral acute pyelonephritis with subsequent sepsis.  The patient will be admitted to medical monitored bed.  She will be placed on hydration with IV normal saline continue antibiotic therapy with IV Rocephin.  Will follow urine and blood culture and sensitivity.  PRN IV Toradol will be provided for pain.  2.  Hypokalemia.  She will be followed after replacement given in the ER.  Will check her magnesium level.  3.  Alcohol  abuse.  She will be placed on PRN IV Ativan and a banana bag  daily.  4.  Tobacco abuse.  She was counseled for smoking cessation and will receive further counseling here.  5.  DVT prophylaxis.  Subcutaneous Lovenox  All the records are reviewed and case discussed with ED provider. The plan of care was discussed in details with the patient (and family). I answered all questions. The patient agreed to proceed with the above mentioned plan. Further management will depend upon hospital course.   CODE STATUS: Full code  TOTAL TIME TAKING CARE OF THIS PATIENT: 50 minutes.    Christel Mormon M.D on 01/20/2019 at 2:38 AM  Pager - [phone]  After 6pm go to [email or web address] - Proofreader  [facility]  Office  [phone]  CC: Primary care physician; Patient, No Pcp Per   Note: This dictation was prepared with Dragon dictation along with smaller phrase technology. Any transcriptional errors that result from this process are unintentional.

## 2019-01-20 NOTE — ED Notes (Signed)
ED TO INPATIENT HANDOFF REPORT  ED Nurse Name and Phone #: Radha Coggins 14  S Name/Age/Gender Jasmine Blankenship 38 y.o. female Room/Bed: ED18A/ED18A  Code Status   Code Status: Full Code  Home/SNF/Other Home Patient oriented to: self, place, time and situation Is this baseline? Yes   Triage Complete: Triage complete  Chief Complaint urinary frequency   Triage Note Patient ambulatory to triage with steady gait, without difficulty, tearful, mask in place;  c/o lower back pain & urinary frequency x wk, body aches, tremors; drink approx pint every day but no alcohol since Sunday   Allergies No Known Allergies  Level of Care/Admitting Diagnosis ED Disposition    ED Disposition Condition [place]: [place] [[id number]]  Level of Care: Med-Surg [16]  Covid Evaluation: Confirmed COVID Negative  Diagnosis: Acute pyelonephritis [528413]  Admitting Physician: Christel Mormon [[id number]]  Attending Physician: Christel Mormon [[id number]]  Estimated length of stay: past midnight tomorrow  Certification:: I certify this patient will need inpatient services for at least 2 midnights  PT Class (Do Not Modify): Inpatient [101]  PT Acc Code (Do Not Modify): Private [1]       B Medical/Surgery History Past Medical History:  Diagnosis Date  . Herpes    History reviewed. No pertinent surgical history.   A IV Location/Drains/Wounds Patient Lines/Drains/Airways Status   Active Line/Drains/Airways    Name:   Placement date:   Placement time:   Site:   Days:   Peripheral IV 01/19/19 Left Antecubital   01/19/19    2313    Antecubital   1          Intake/Output Last 24 hours No intake or output data in the 24 hours ending 01/20/19 0258  Labs/Imaging Results for orders placed or performed during the hospital encounter of 01/19/19 (from the past 48 hour(s))  Urinalysis, Complete w Microscopic     Status: Abnormal   Collection Time: 01/19/19 11:03 PM   Result Value Ref Range   Color, Urine YELLOW (A) YELLOW   APPearance HAZY (A) CLEAR   Specific Gravity, Urine 1.009 1.005 - 1.030   pH 6.0 5.0 - 8.0   Glucose, UA NEGATIVE NEGATIVE mg/dL   Hgb urine dipstick MODERATE (A) NEGATIVE   Bilirubin Urine NEGATIVE NEGATIVE   Ketones, ur NEGATIVE NEGATIVE mg/dL   Protein, ur 100 (A) NEGATIVE mg/dL   Nitrite NEGATIVE NEGATIVE   Leukocytes,Ua SMALL (A) NEGATIVE   RBC / HPF 11-20 0 - 5 RBC/hpf   WBC, UA >50 (H) 0 - 5 WBC/hpf   Bacteria, UA RARE (A) NONE SEEN   Squamous Epithelial / LPF 0-5 0 - 5   Mucus PRESENT     Comment: Performed at [facility], [address] [place], [place] [address]  Pregnancy, urine POC     Status: None   Collection Time: 01/19/19 11:05 PM  Result Value Ref Range   Preg Test, Ur NEGATIVE NEGATIVE    Comment:        THE SENSITIVITY OF THIS METHODOLOGY IS >24 mIU/mL   CBC     Status: Abnormal   Collection Time: 01/19/19 11:13 PM  Result Value Ref Range   WBC 22.4 (H) 4.0 - 10.5 K/uL   RBC 4.67 3.87 - 5.11 MIL/uL   Hemoglobin 13.0 12.0 - 15.0 g/dL   HCT 38.9 36.0 - 46.0 %   MCV 83.3 80.0 - 100.0 fL   MCH 27.8 26.0 - 34.0  pg   MCHC 33.4 30.0 - 36.0 g/dL   RDW 11.915.7 (H) 14.711.5 - 82.915.5 %   Platelets 201 150 - 400 K/uL   nRBC 0.0 0.0 - 0.2 %    Comment: Performed at [facility], [address] [place], [place] [address]  Comprehensive metabolic panel     Status: Abnormal   Collection Time: 01/19/19 11:13 PM  Result Value Ref Range   Sodium 133 (L) 135 - 145 mmol/L   Potassium 2.4 (LL) 3.5 - 5.1 mmol/L    Comment: CRITICAL RESULT CALLED TO, READ BACK BY AND VERIFIED WITH Jamaal Bernasconi RN AT 2340 ON 01/19/2019 SNG    Chloride 98 98 - 111 mmol/L   CO2 23 22 - 32 mmol/L   Glucose, Bld 167 (H) 70 - 99 mg/dL   BUN 10 6 - 20 mg/dL   Creatinine, Ser 0.860.98 0.44 - 1.00 mg/dL   Calcium 8.5 (L) 8.9 - 10.3 mg/dL   Total Protein 7.2 6.5 - 8.1 g/dL   Albumin 3.3 (L) 3.5 - 5.0 g/dL   AST 17 15 - 41  U/L   ALT 14 0 - 44 U/L   Alkaline Phosphatase 88 38 - 126 U/L   Total Bilirubin 0.5 0.3 - 1.2 mg/dL   GFR calc non Af Amer >60 >60 mL/min   GFR calc Af Amer >60 >60 mL/min   Anion gap 12 5 - 15    Comment: Performed at [facility], [address] [place], [place] [address]  Lactic acid, plasma     Status: None   Collection Time: 01/19/19 11:13 PM  Result Value Ref Range   Lactic Acid, Venous 1.2 0.5 - 1.9 mmol/L    Comment: Performed at [facility], [address] [place], [place] [address]  SARS Coronavirus 2 (CEPHEID- Performed in [facility] hospital lab), Hosp Order     Status: None   Collection Time: 01/20/19 12:08 AM   Specimen: Nasopharyngeal Swab  Result Value Ref Range   SARS Coronavirus 2 NEGATIVE NEGATIVE    Comment: (NOTE) If result is NEGATIVE SARS-CoV-2 target nucleic acids are NOT DETECTED. The SARS-CoV-2 RNA is generally detectable in upper and lower  respiratory specimens during the acute phase of infection. The lowest  concentration of SARS-CoV-2 viral copies this assay can detect is 250  copies / mL. A negative result does not preclude SARS-CoV-2 infection  and should not be used as the sole basis for treatment or other  patient management decisions.  A negative result may occur with  improper specimen collection / handling, submission of specimen other  than nasopharyngeal swab, presence of viral mutation(s) within the  areas targeted by this assay, and inadequate number of viral copies  (<250 copies / mL). A negative result must be combined with clinical  observations, patient history, and epidemiological information. If result is POSITIVE SARS-CoV-2 target nucleic acids are DETECTED. The SARS-CoV-2 RNA is generally detectable in upper and lower  respiratory specimens dur ing the acute phase of infection.  Positive  results are indicative of active infection with SARS-CoV-2.  Clinical  correlation with patient history and other  diagnostic information is  necessary to determine patient infection status.  Positive results do  not rule out bacterial infection or co-infection with other viruses. If result is PRESUMPTIVE POSTIVE SARS-CoV-2 nucleic acids MAY BE PRESENT.   A presumptive positive result was obtained on the submitted specimen  and confirmed on repeat testing.  While 2019 novel coronavirus  (  SARS-CoV-2) nucleic acids may be present in the submitted sample  additional confirmatory testing may be necessary for epidemiological  and / or clinical management purposes  to differentiate between  SARS-CoV-2 and other Sarbecovirus currently known to infect humans.  If clinically indicated additional testing with an alternate test  methodology [phone]([id number]) is advised. The SARS-CoV-2 RNA is generally  detectable in upper and lower respiratory sp ecimens during the acute  phase of infection. The expected result is Negative. Fact Sheet for Patients:  BoilerBrush.com.cyhttps://www.fda.gov/media/136312/download Fact Sheet for Healthcare Providers: https://pope.com/https://www.fda.gov/media/136313/download This test is not yet approved or cleared by the [place] FDA and has been authorized for detection and/or diagnosis of SARS-CoV-2 by FDA under an Emergency Use Authorization (EUA).  This EUA will remain in effect (meaning this test can be used) for the duration of the COVID-19 declaration under Section 564(b)(1) of the Act, 21 U.S.C. section 360bbb-3(b)(1), unless the authorization is terminated or revoked sooner. Performed at [facility], [address] [place], [place] [address]   APTT     Status: None   Collection Time: 01/20/19 12:08 AM  Result Value Ref Range   aPTT 31 24 - 36 seconds    Comment: Performed at [facility], [address] [place], [place] [address]  Protime-INR     Status: None   Collection Time: 01/20/19 12:08 AM  Result Value Ref Range   Prothrombin Time 15.0 11.4 - 15.2 seconds   INR 1.2 0.8 -  1.2    Comment: (NOTE) INR goal varies based on device and disease states. Performed at [facility], [address] [place], [place] [address]    Ct Abdomen Pelvis W Contrast  Result Date: 01/20/2019 CLINICAL DATA:  Initial evaluation for acute generalized abdominal pain. UTI, fever, back pain. EXAM: CT ABDOMEN AND PELVIS WITH CONTRAST TECHNIQUE: Multidetector CT imaging of the abdomen and pelvis was performed using the standard protocol following bolus administration of intravenous contrast. CONTRAST:  100mL OMNIPAQUE IOHEXOL 300 MG/ML  SOLN COMPARISON:  None. FINDINGS: Lower chest: Visualized lung bases are clear. Hepatobiliary: Liver demonstrates a normal contrast enhanced appearance. Gallbladder within normal limits. No biliary dilatation. Pancreas: Pancreas within normal limits. Spleen: Spleen within normal limits. Adrenals/Urinary Tract: Adrenal glands are normal. Kidneys demonstrate a heterogeneous and somewhat mottled, striated nephrogram bilaterally, suspicious for acute pyelonephritis. Left kidney slightly enlarged as compared to the. Mild bilateral perinephric fat stranding. No nephrolithiasis or hydronephrosis. No focal enhancing renal mass. No visible hydroureter. Partially distended bladder within normal limits. Stomach/Bowel: Stomach within normal limits. No evidence for bowel obstruction. No acute inflammatory changes seen about the bowels. No evidence for acute appendicitis. Vascular/Lymphatic: Normal intravascular enhancement seen throughout the intra-abdominal aorta. Mesenteric vessels patent proximally. No adenopathy. Reproductive: Uterus and ovaries within normal limits for age. Other: No free air or fluid. Musculoskeletal: No acute osseous finding. No discrete lytic or blastic osseous lesions. IMPRESSION: 1. Heterogeneous striated nephrogram bilaterally, suspicious for acute pyelonephritis. Correlation with urinalysis recommended. 2. No other acute intra-abdominal or pelvic  process identified. Electronically Signed   By: Rise MuBenjamin  McClintock M.D.   On: 01/20/2019 01:29   Dg Chest Port 1 View  Result Date: 01/20/2019 CLINICAL DATA:  Initial evaluation for acute sepsis. EXAM: PORTABLE CHEST 1 VIEW COMPARISON:  Prior radiograph from 08/09/2010 FINDINGS: Cardiac and mediastinal silhouettes are stable in size and contour, and remain within normal limits. Lungs are hyperinflated with underlying emphysematous changes. No focal infiltrates. No edema or effusion. No pneumothorax. No acute osseous  finding. IMPRESSION: 1. Hyperinflation with underlying emphysema. 2. No other active cardiopulmonary disease. Electronically Signed   By: Rise MuBenjamin  McClintock M.D.   On: 01/20/2019 00:43    Pending Labs Unresulted Labs (From admission, onward)    Start     Ordered   01/20/19 0500  CBC  Tomorrow morning,   STAT     01/20/19 0236   01/20/19 0500  Basic metabolic panel  Tomorrow morning,   STAT     01/20/19 0236   01/19/19 2347  Blood Culture (routine x 2)  BLOOD CULTURE X 2,   STAT     01/19/19 2352   01/19/19 2347  Urine culture  ONCE - STAT,   STAT     01/19/19 2352          Vitals/Pain Today's Vitals   01/20/19 0000 01/20/19 0030 01/20/19 0117 01/20/19 0201  BP: 116/83 103/71 106/75 108/75  Pulse: 96 84 86 80  Resp: (!) 21 18 13 17   Temp:      TempSrc:      SpO2: 96% 97% 99% 99%  Weight:      Height:      PainSc:        Isolation Precautions No active isolations  Medications Medications  ketorolac (TORADOL) 30 MG/ML injection 30 mg (0 mg Intravenous Hold 01/20/19 0115)  potassium chloride 10 mEq in 100 mL IVPB (0 mEq Intravenous Stopped 01/20/19 0256)  hydrOXYzine (ATARAX/VISTARIL) tablet 50 mg (has no administration in time range)  tobramycin (TOBREX) 0.3 % ophthalmic solution 1 drop (has no administration in time range)  enoxaparin (LOVENOX) injection 40 mg (has no administration in time range)  0.9 %  sodium chloride infusion (has no administration in  time range)  acetaminophen (TYLENOL) tablet 650 mg (has no administration in time range)    Or  acetaminophen (TYLENOL) suppository 650 mg (has no administration in time range)  traZODone (DESYREL) tablet 25 mg (has no administration in time range)  magnesium hydroxide (MILK OF MAGNESIA) suspension 30 mL (has no administration in time range)  ondansetron (ZOFRAN) tablet 4 mg (has no administration in time range)    Or  ondansetron (ZOFRAN) injection 4 mg (has no administration in time range)  ketorolac (TORADOL) 30 MG/ML injection 15 mg (has no administration in time range)  oxyCODONE (Oxy IR/ROXICODONE) immediate release tablet 5 mg (has no administration in time range)  acetaminophen (TYLENOL) tablet 1,000 mg (1,000 mg Oral Given 01/19/19 2300)  sodium chloride 0.9 % bolus 1,000 mL (0 mLs Intravenous Stopped 01/20/19 0055)  cefTRIAXone (ROCEPHIN) 1 g in sodium chloride 0.9 % 100 mL IVPB (0 g Intravenous Stopped 01/20/19 0051)  potassium chloride SA (K-DUR) CR tablet 40 mEq (40 mEq Oral Given 01/20/19 0114)  iohexol (OMNIPAQUE) 300 MG/ML solution 100 mL (100 mLs Intravenous Contrast Given 01/20/19 0050)    Mobility walks Low fall risk   Focused Assessments Cardiac Assessment Handoff:    No results found for: CKTOTAL, CKMB, CKMBINDEX, TROPONINI No results found for: DDIMER Does the Patient currently have chest pain? No   , Renal Assessment Handoff:       R Recommendations: See Admitting Provider Note  Report given to:   Additional Notes: pt states drinks regularly, has not drank since Sunday. No signs of d/t

## 2019-01-21 LAB — BASIC METABOLIC PANEL
Anion gap: 10 (ref 5–15)
BUN: 9 mg/dL (ref 6–20)
CO2: 21 mmol/L — ABNORMAL LOW (ref 22–32)
Calcium: 8 mg/dL — ABNORMAL LOW (ref 8.9–10.3)
Chloride: 107 mmol/L (ref 98–111)
Creatinine, Ser: 1.08 mg/dL — ABNORMAL HIGH (ref 0.44–1.00)
GFR calc Af Amer: >60 mL/min (ref >60)
GFR calc non Af Amer: >60 mL/min (ref >60)
Glucose, Bld: 96 mg/dL (ref 70–99)
Potassium: 3.9 mmol/L (ref 3.5–5.1)
Sodium: 138 mmol/L (ref 135–145)

## 2019-01-21 LAB — CBC
HCT: 39.2 % (ref 36.0–46.0)
Hemoglobin: 12.9 g/dL (ref 12.0–15.0)
MCH: 27.7 pg (ref 26.0–34.0)
MCHC: 32.9 g/dL (ref 30.0–36.0)
MCV: 84.3 fL (ref 80.0–100.0)
Platelets: 223 10*3/uL (ref 150–400)
RBC: 4.65 MIL/uL (ref 3.87–5.11)
RDW: 16.5 % — ABNORMAL HIGH (ref 11.5–15.5)
WBC: 22.5 10*3/uL — ABNORMAL HIGH (ref 4.0–10.5)
nRBC: 0 % (ref 0.0–0.2)

## 2019-01-21 NOTE — Progress Notes (Signed)
[place] [facility] [place] NAME: Geeta Dworkin    MR#:  [id number]  DATE OF BIRTH:  Jan 09, 1981  SUBJECTIVE:   She admitted to the hospital secondary to urinary frequency and dysuria and noted to have urinary tract infection with acute pyelonephritis.  Patient says she feels a lot better after being started on fluids and antibiotics than yday A bit tearful--missing her family!  REVIEW OF SYSTEMS:    Review of Systems  Constitutional: Negative for chills and fever.  HENT: Negative for congestion and tinnitus.   Eyes: Negative for blurred vision and double vision.  Respiratory: Negative for cough, shortness of breath and wheezing.   Cardiovascular: Negative for chest pain, orthopnea and PND.  Gastrointestinal: Positive for abdominal pain and nausea. Negative for diarrhea and vomiting.  Genitourinary: Negative for dysuria and hematuria.  Neurological: Negative for dizziness, sensory change and focal weakness.  All other systems reviewed and are negative.   Nutrition: Regular Tolerating Diet: Yes   DRUG ALLERGIES:  No Known Allergies  VITALS:  Blood pressure 98/67, pulse 87, temperature 99.2 F (37.3 C), temperature source Oral, resp. rate 17, height 5\' 10"  (1.778 m), weight 60.2 kg, SpO2 100 %.  PHYSICAL EXAMINATION:   Physical Exam  GENERAL:  38 y.o.-year-old patient lying in bed in no acute distress.  EYES: Pupils equal, round, reactive to light and accommodation. No scleral icterus. Extraocular muscles intact.  HEENT: Head atraumatic, normocephalic. Oropharynx and nasopharynx clear.  NECK:  Supple, no jugular venous distention. No thyroid enlargement, no tenderness.  LUNGS: Normal breath sounds bilaterally, no wheezing, rales, rhonchi. No use of accessory muscles of respiration.  CARDIOVASCULAR: S1, S2 normal. No murmurs, rubs, or gallops.  ABDOMEN: Soft, nontender, nondistended. Bowel sounds present. No organomegaly or mass.    EXTREMITIES: No cyanosis, clubbing or edema b/l.    NEUROLOGIC: Cranial nerves II through XII are intact. No focal Motor or sensory deficits b/l.   PSYCHIATRIC: The patient is alert and oriented x 3.  SKIN: No obvious rash, lesion, or ulcer.    LABORATORY PANEL:   CBC Recent Labs  Lab 01/21/19 0442  WBC 22.5*  HGB 12.9  HCT 39.2  PLT 223   ------------------------------------------------------------------------------------------------------------------  Chemistries  Recent Labs  Lab 01/19/19 2313 01/20/19 0651 01/21/19 0442  NA 133* 138 138  K 2.4* 4.0 3.9  CL 98 106 107  CO2 23 23 21*  GLUCOSE 167* 162* 96  BUN 10 8 9   CREATININE 0.98 0.95 1.08*  CALCIUM 8.5* 7.9* 8.0*  MG  --  1.7  --   AST 17  --   --   ALT 14  --   --   ALKPHOS 88  --   --   BILITOT 0.5  --   --    ------------------------------------------------------------------------------------------------------------------  Cardiac Enzymes No results for input(s): TROPONINI in the last 168 hours. ------------------------------------------------------------------------------------------------------------------  RADIOLOGY:  Ct Abdomen Pelvis W Contrast  Result Date: 01/20/2019 CLINICAL DATA:  Initial evaluation for acute generalized abdominal pain. UTI, fever, back pain. EXAM: CT ABDOMEN AND PELVIS WITH CONTRAST TECHNIQUE: Multidetector CT imaging of the abdomen and pelvis was performed using the standard protocol following bolus administration of intravenous contrast. CONTRAST:  161mL OMNIPAQUE IOHEXOL 300 MG/ML  SOLN COMPARISON:  None. FINDINGS: Lower chest: Visualized lung bases are clear. Hepatobiliary: Liver demonstrates a normal contrast enhanced appearance. Gallbladder within normal limits. No biliary dilatation. Pancreas: Pancreas within normal limits. Spleen: Spleen within normal limits. Adrenals/Urinary  Tract: Adrenal glands are normal. Kidneys demonstrate a heterogeneous and somewhat mottled,  striated nephrogram bilaterally, suspicious for acute pyelonephritis. Left kidney slightly enlarged as compared to the. Mild bilateral perinephric fat stranding. No nephrolithiasis or hydronephrosis. No focal enhancing renal mass. No visible hydroureter. Partially distended bladder within normal limits. Stomach/Bowel: Stomach within normal limits. No evidence for bowel obstruction. No acute inflammatory changes seen about the bowels. No evidence for acute appendicitis. Vascular/Lymphatic: Normal intravascular enhancement seen throughout the intra-abdominal aorta. Mesenteric vessels patent proximally. No adenopathy. Reproductive: Uterus and ovaries within normal limits for age. Other: No free air or fluid. Musculoskeletal: No acute osseous finding. No discrete lytic or blastic osseous lesions. IMPRESSION: 1. Heterogeneous striated nephrogram bilaterally, suspicious for acute pyelonephritis. Correlation with urinalysis recommended. 2. No other acute intra-abdominal or pelvic process identified. Electronically Signed   By: Rise MuBenjamin  McClintock M.D.   On: 01/20/2019 01:29   Dg Chest Port 1 View  Result Date: 01/20/2019 CLINICAL DATA:  Initial evaluation for acute sepsis. EXAM: PORTABLE CHEST 1 VIEW COMPARISON:  Prior radiograph from 08/09/2010 FINDINGS: Cardiac and mediastinal silhouettes are stable in size and contour, and remain within normal limits. Lungs are hyperinflated with underlying emphysematous changes. No focal infiltrates. No edema or effusion. No pneumothorax. No acute osseous finding. IMPRESSION: 1. Hyperinflation with underlying emphysema. 2. No other active cardiopulmonary disease. Electronically Signed   By: Rise MuBenjamin  McClintock M.D.   On: 01/20/2019 00:43     ASSESSMENT AND PLAN:   38 year old female with no significant past medical history presents to the hospital due to dysuria, urinary frequency and noted to have sepsis secondary to acute pyelonephritis.  1.  Sepsis-patient meets  criteria given her leukocytosis, fever urinalysis that is positive and CT scan suggestive of pyelonephritis. -Continue IV fluids, IV ceftriaxone.  Follow Blood cultures negative  Clinically improving. -UC pending  2.  Acute pyelonephritis-source of patient's sepsis. -Continue IV fluids, pain control, antiemetics. -Continue IV ceftriaxone, follow cultures.  3.  Leukocytosis-secondary to the pyelonephritis. - Follow with IV antibiotic therapy. - wbc 22--21--22K--cbc tomorrow  4.  Hypokalemia-improved and resolved with supplementation.  5.  Alcohol abuse-patient is high risk for alcohol withdrawal. -Continue thiamine, folate.  Continue Ativan as needed.   All the records are reviewed and case discussed with Care Management/Social Worker. Management plans discussed with the patient  CODE STATUS: Full code  DVT Prophylaxis: Lovenox  TOTAL TIME TAKING CARE OF THIS PATIENT: 30 minutes.   POSSIBLE D/C IN 1-2 DAYS, DEPENDING ON CLINICAL CONDITION.   Enedina FinnerSona Enas Winchel M.D on 01/21/2019 at 2:24 PM  Between 7am to 6pm - Pager - [phone]  After 6pm go to [email or web address] - Social research officer, governmentpassword [email or web address]  [facility]  Office  [phone]  CC: Primary care physician; Patient, No Pcp Per

## 2019-01-22 LAB — CBC
HCT: 31.6 % — ABNORMAL LOW (ref 36.0–46.0)
Hemoglobin: 10.5 g/dL — ABNORMAL LOW (ref 12.0–15.0)
MCH: 27.8 pg (ref 26.0–34.0)
MCHC: 33.2 g/dL (ref 30.0–36.0)
MCV: 83.6 fL (ref 80.0–100.0)
Platelets: 219 10*3/uL (ref 150–400)
RBC: 3.78 MIL/uL — ABNORMAL LOW (ref 3.87–5.11)
RDW: 16.4 % — ABNORMAL HIGH (ref 11.5–15.5)
WBC: 14.5 10*3/uL — ABNORMAL HIGH (ref 4.0–10.5)
nRBC: 0 % (ref 0.0–0.2)

## 2019-01-22 MED ORDER — THIAMINE HCL 100 MG/ML IJ SOLN
Freq: Every day | INTRAVENOUS | Status: DC
  Filled 2019-01-22 (×2): qty 1000

## 2019-01-22 MED ORDER — SODIUM CHLORIDE 0.9 % IV SOLN: Freq: Every day | INTRAVENOUS | Status: DC

## 2019-01-22 MED ORDER — CEPHALEXIN 500 MG PO CAPS
500.0000 mg | ORAL_CAPSULE | Freq: Three times a day (TID) | ORAL | Status: DC
  Administered 2019-01-22: 13:00:00 500 mg via ORAL
  Filled 2019-01-22: qty 1

## 2019-01-22 MED ORDER — CHLORDIAZEPOXIDE HCL 10 MG PO CAPS: 10.0000 mg | ORAL_CAPSULE | Freq: Every day | ORAL | Status: DC

## 2019-01-22 MED ORDER — CHLORDIAZEPOXIDE HCL 10 MG PO CAPS: 10.0000 mg | ORAL_CAPSULE | Freq: Two times a day (BID) | ORAL | Status: DC

## 2019-01-22 MED ORDER — CHLORDIAZEPOXIDE HCL 25 MG PO CAPS
25.0000 mg | ORAL_CAPSULE | Freq: Two times a day (BID) | ORAL | Status: DC
  Administered 2019-01-22: 11:00:00 25 mg via ORAL
  Filled 2019-01-22: qty 1

## 2019-01-22 MED ORDER — SODIUM CHLORIDE 0.9 % IV SOLN
Freq: Once | INTRAVENOUS | Status: DC
  Administered 2019-01-22: 06:00:00 via INTRAVENOUS

## 2019-01-22 MED ORDER — CEPHALEXIN 500 MG PO CAPS: 500.0000 mg | ORAL_CAPSULE | Freq: Three times a day (TID) | ORAL | 0 refills | Status: DC

## 2019-01-22 NOTE — Progress Notes (Signed)
Patient noted with tremor and very tearful. This was in addition to patient's c/o head ache. PRN tylenol was given. Patient reported to this RN she drink liquor all day everyday and can out drink most men. MD was made aware. New orders received.

## 2019-01-22 NOTE — Discharge Summary (Signed)
[facility] NAME: Jasmine Blankenship    MR#:  [id number]  DATE OF BIRTH:  07/27/1980  DATE OF ADMISSION:  01/19/2019 ADMITTING PHYSICIAN: Christel Mormon, MD  DATE OF DISCHARGE: 01/22/2019  PRIMARY CARE PHYSICIAN: Patient, No Pcp Per    ADMISSION DIAGNOSIS:  Hypokalemia [E87.6] Pyelonephritis [N12] Sepsis, due to unspecified organism, unspecified whether acute organ dysfunction present ([place]) [A41.9]  DISCHARGE DIAGNOSIS:  sepsis due to pyelonephritis UTI  SECONDARY DIAGNOSIS:   Past Medical History:  Diagnosis Date  . Herpes     HOSPITAL COURSE:   38 year old female with no significant past medical history presents to the hospital due to dysuria, urinary frequency and noted to have sepsis secondary to acute pyelonephritis.  1.  Sepsis-patient meets criteria given her leukocytosis, fever urinalysis that is positive and CT scan suggestive of pyelonephritis. -Received IV fluids, IV ceftriaxone-- oral Keflex.  Blood cultures negative  Clinically improving. -UC pending-- per lab they just received the sample last night at 9 PM.  2.  Acute pyelonephritis-source of patient's sepsis. -Received IV fluids, pain control, antiemetics. -Continue antibiotics as above. Patient overall feels better.  3.  Leukocytosis-secondary to the pyelonephritis. - Follow with IV antibiotic therapy. - wbc 22--21--22K-- 14 K  4.  Hypokalemia-improved and resolved with supplementation.  5.  Alcohol abuse-patient is atrisk for alcohol withdrawal. -Continue thiamine, folate.  Continue Ativan as needed. -She advised on alcohol abstinence. Patient says she does not drink much. She is planning on not drinking point she leaves the hospital. She is currently not showing symptoms of withdrawal.last CIWA 3  Patient will discharged to home. She is advised to drink plenty of fluids. Finish up antibiotics. Follow-up ER/urgent care if needed. She is also  advised to get PCP in the area once her insurance through work allows. CONSULTS OBTAINED:    DRUG ALLERGIES:  No Known Allergies  DISCHARGE MEDICATIONS:   Allergies as of 01/22/2019   No Known Allergies     Medication List    TAKE these medications   cephALEXin 500 MG capsule Commonly known as: KEFLEX Take 1 capsule (500 mg total) by mouth 3 (three) times daily.   multivitamin with minerals Tabs tablet Take 1 tablet by mouth daily.       If you experience worsening of your admission symptoms, develop shortness of breath, life threatening emergency, suicidal or homicidal thoughts you must seek medical attention immediately by calling 911 or calling your MD immediately  if symptoms less severe.  You Must read complete instructions/literature along with all the possible adverse reactions/side effects for all the Medicines you take and that have been prescribed to you. Take any new Medicines after you have completely understood and accept all the possible adverse reactions/side effects.   Please note  You were cared for by a hospitalist during your hospital stay. If you have any questions about your discharge medications or the care you received while you were in the hospital after you are discharged, you can call the unit and asked to speak with the hospitalist on call if the hospitalist that took care of you is not available. Once you are discharged, your primary care physician will handle any further medical issues. Please note that NO REFILLS for any discharge medications will be authorized once you are discharged, as it is imperative that you return to your primary care physician (or establish a relationship with a primary care physician if you do not have one)  for your aftercare needs so that they can reassess your need for medications and monitor your lab values. Today   SUBJECTIVE  overall feels better. Eating well. Drinking well   VITAL SIGNS:  Blood pressure 109/73, pulse  87, temperature 97.8 F (36.6 C), temperature source Oral, resp. rate 19, height 5\' 10"  (1.778 m), weight 60.2 kg, SpO2 100 %.  I/O:    Intake/Output Summary (Last 24 hours) at 01/22/2019 1204 Last data filed at 01/22/2019 0700 Gross per 24 hour  Intake 2352.1 ml  Output 800 ml  Net 1552.1 ml    PHYSICAL EXAMINATION:  GENERAL:  38 y.o.-year-old patient lying in the bed with no acute distress.  EYES: Pupils equal, round, reactive to light and accommodation. No scleral icterus. Extraocular muscles intact.  HEENT: Head atraumatic, normocephalic. Oropharynx and nasopharynx clear.  NECK:  Supple, no jugular venous distention. No thyroid enlargement, no tenderness.  LUNGS: Normal breath sounds bilaterally, no wheezing, rales,rhonchi or crepitation. No use of accessory muscles of respiration.  CARDIOVASCULAR: S1, S2 normal. No murmurs, rubs, or gallops.  ABDOMEN: Soft, non-tender, non-distended. Bowel sounds present. No organomegaly or mass.  EXTREMITIES: No pedal edema, cyanosis, or clubbing.  NEUROLOGIC: Cranial nerves II through XII are intact. Muscle strength 5/5 in all extremities. Sensation intact. Gait not checked. No tremors noted PSYCHIATRIC: The patient is alert and oriented x 3.  SKIN: No obvious rash, lesion, or ulcer.   DATA REVIEW:   CBC  Recent Labs  Lab 01/22/19 0424  WBC 14.5*  HGB 10.5*  HCT 31.6*  PLT 219    Chemistries  Recent Labs  Lab 01/19/19 2313 01/20/19 0651 01/21/19 0442  NA 133* 138 138  K 2.4* 4.0 3.9  CL 98 106 107  CO2 23 23 21*  GLUCOSE 167* 162* 96  BUN 10 8 9   CREATININE 0.98 0.95 1.08*  CALCIUM 8.5* 7.9* 8.0*  MG  --  1.7  --   AST 17  --   --   ALT 14  --   --   ALKPHOS 88  --   --   BILITOT 0.5  --   --     Microbiology Results   Recent Results (from the past 240 hour(s))  Blood Culture (routine x 2)     Status: None (Preliminary result)   Collection Time: 01/19/19 11:13 PM   Specimen: BLOOD  Result Value Ref Range Status    Specimen Description BLOOD LEFT ANTECUBITAL  Final   Special Requests   Final    BOTTLES DRAWN AEROBIC AND ANAEROBIC Blood Culture results may not be optimal due to an excessive volume of blood received in culture bottles   Culture   Final    NO GROWTH 2 DAYS Performed at [facility], [address] [place], [place] [address]    Report Status PENDING  Incomplete  SARS Coronavirus 2 (CEPHEID- Performed in [facility] hospital lab), Hosp Order     Status: None   Collection Time: 01/20/19 12:08 AM   Specimen: Nasopharyngeal Swab  Result Value Ref Range Status   SARS Coronavirus 2 NEGATIVE NEGATIVE Final    Comment: (NOTE) If result is NEGATIVE SARS-CoV-2 target nucleic acids are NOT DETECTED. The SARS-CoV-2 RNA is generally detectable in upper and lower  respiratory specimens during the acute phase of infection. The lowest  concentration of SARS-CoV-2 viral copies this assay can detect is 250  copies / mL. A negative result does not preclude SARS-CoV-2 infection  and should not  be used as the sole basis for treatment or other  patient management decisions.  A negative result may occur with  improper specimen collection / handling, submission of specimen other  than nasopharyngeal swab, presence of viral mutation(s) within the  areas targeted by this assay, and inadequate number of viral copies  (<250 copies / mL). A negative result must be combined with clinical  observations, patient history, and epidemiological information. If result is POSITIVE SARS-CoV-2 target nucleic acids are DETECTED. The SARS-CoV-2 RNA is generally detectable in upper and lower  respiratory specimens dur ing the acute phase of infection.  Positive  results are indicative of active infection with SARS-CoV-2.  Clinical  correlation with patient history and other diagnostic information is  necessary to determine patient infection status.  Positive results do  not rule out bacterial infection or  co-infection with other viruses. If result is PRESUMPTIVE POSTIVE SARS-CoV-2 nucleic acids MAY BE PRESENT.   A presumptive positive result was obtained on the submitted specimen  and confirmed on repeat testing.  While 2019 novel coronavirus  (SARS-CoV-2) nucleic acids may be present in the submitted sample  additional confirmatory testing may be necessary for epidemiological  and / or clinical management purposes  to differentiate between  SARS-CoV-2 and other Sarbecovirus currently known to infect humans.  If clinically indicated additional testing with an alternate test  methodology [phone]([id number]) is advised. The SARS-CoV-2 RNA is generally  detectable in upper and lower respiratory sp ecimens during the acute  phase of infection. The expected result is Negative. Fact Sheet for Patients:  BoilerBrush.com.cyhttps://www.fda.gov/media/136312/download Fact Sheet for Healthcare Providers: https://pope.com/https://www.fda.gov/media/136313/download This test is not yet approved or cleared by the [place] FDA and has been authorized for detection and/or diagnosis of SARS-CoV-2 by FDA under an Emergency Use Authorization (EUA).  This EUA will remain in effect (meaning this test can be used) for the duration of the COVID-19 declaration under Section 564(b)(1) of the Act, 21 U.S.C. section 360bbb-3(b)(1), unless the authorization is terminated or revoked sooner. Performed at [facility], [address] [place], [place] [address]   Blood Culture (routine x 2)     Status: None (Preliminary result)   Collection Time: 01/20/19 12:15 AM   Specimen: BLOOD  Result Value Ref Range Status   Specimen Description BLOOD BLOOD LEFT FOREARM  Final   Special Requests   Final    BOTTLES DRAWN AEROBIC AND ANAEROBIC Blood Culture adequate volume   Culture   Final    NO GROWTH 2 DAYS Performed at [facility], [address] [place], [place] [address]    Report Status PENDING  Incomplete    RADIOLOGY:  No  results found.   CODE STATUS:     Code Status Orders  (From admission, onward)         Start     Ordered   01/20/19 0232  Full code  Continuous     01/20/19 0236        Code Status History    This patient has a current code status but no historical code status.   Advance Care Planning Activity      TOTAL TIME TAKING CARE OF THIS PATIENT: 40 minutes.    Enedina FinnerSona Marilouise Densmore M.D on 01/22/2019 at 12:04 PM  Between 7am to 6pm - Pager - [phone] After 6pm go to [email or web address] - password [email or web address]  [facility]  Office  [phone]  CC: Primary care physician; Patient, No Pcp Per

## 2019-01-22 NOTE — Discharge Instructions (Signed)
Patient advised to stop drinking alcohol she is advised to follow-up with urgent care/ER if sign symptoms recur. Advised to drink plenty of fluids.

## 2019-01-22 NOTE — Progress Notes (Signed)
Patient very tearful and request something for sleep. PRN ativan administered.

## 2019-01-23 LAB — URINE CULTURE: Culture: NO GROWTH

## 2019-01-25 LAB — CULTURE, BLOOD (ROUTINE X 2)
Culture: NO GROWTH
Culture: NO GROWTH
Special Requests: ADEQUATE

## 2019-02-15 ENCOUNTER — Ambulatory Visit: Payer: Self-pay | Admitting: Nurse Practitioner

## 2019-02-15 ENCOUNTER — Encounter: Payer: Self-pay | Admitting: Nurse Practitioner

## 2019-02-15 ENCOUNTER — Other Ambulatory Visit: Payer: Self-pay

## 2019-02-15 DIAGNOSIS — N760 Acute vaginitis: Secondary | ICD-10-CM

## 2019-02-15 DIAGNOSIS — N898 Other specified noninflammatory disorders of vagina: Secondary | ICD-10-CM

## 2019-02-15 DIAGNOSIS — B9689 Other specified bacterial agents as the cause of diseases classified elsewhere: Secondary | ICD-10-CM

## 2019-02-15 DIAGNOSIS — Z113 Encounter for screening for infections with a predominantly sexual mode of transmission: Secondary | ICD-10-CM

## 2019-02-15 LAB — WET PREP FOR TRICH, YEAST, CLUE
Trichomonas Exam: NEGATIVE
Yeast Exam: NEGATIVE

## 2019-02-15 LAB — PREGNANCY, URINE: Preg Test, Ur: NEGATIVE

## 2019-02-15 MED ORDER — METRONIDAZOLE 500 MG PO TABS: 500.0000 mg | ORAL_TABLET | Freq: Two times a day (BID) | ORAL | 0 refills | Status: AC

## 2019-02-15 MED ORDER — THERA VITAL M PO TABS: 1.0000 | ORAL_TABLET | Freq: Every day | ORAL | 0 refills | Status: DC

## 2019-02-15 NOTE — Progress Notes (Signed)
Patient here for STD testing, and having strange menstrual symptoms.Jenetta Downer, RN

## 2019-02-15 NOTE — Progress Notes (Signed)
STI clinic/screening visit  Subjective:  Jasmine Blankenship is a 38 y.o. female being seen today for an STI screening visit. The patient reports they do have symptoms.  Patient has the following medical conditions:   Patient Active Problem List   Diagnosis Date Noted  . Acute pyelonephritis 01/20/2019     Chief Complaint  Patient presents with  . Exposure to STD    Here for STD testing and abnormal vaginal bleeding   Patient reports - symptoms of menstrual spotting   See flowsheet for further details and programmatic requirements.    The following portions of the patient's history were reviewed and updated as appropriate: allergies, current medications, past medical history, past social history, past surgical history and problem list.  Objective:  There were no vitals filed for this visit.  Physical Exam Vitals signs and nursing note reviewed.  Constitutional:      Appearance: Normal appearance.  HENT:     Head: Normocephalic and atraumatic.     Mouth/Throat:     Mouth: Mucous membranes are moist.     Pharynx: Oropharynx is clear. No oropharyngeal exudate or posterior oropharyngeal erythema.  Pulmonary:     Effort: Pulmonary effort is normal.  Abdominal:     General: Abdomen is flat.     Palpations: There is no mass.     Tenderness: There is no abdominal tenderness. There is no rebound.  Genitourinary:    General: Normal vulva.     Exam position: Lithotomy position.     Pubic Area: No rash or pubic lice.      Labia:        Right: No rash or lesion.        Left: No rash or lesion.      Vagina: Normal. No vaginal discharge, erythema, bleeding or lesions.     Cervix: Discharge (small amt of pink to tan discharge note, > 4.5 ph and foul odor noted) present. No cervical motion tenderness, friability, lesion or erythema.     Uterus: Normal.      Adnexa: Right adnexa normal and left adnexa normal.     Rectum: Normal.  Lymphadenopathy:     Head:     Right side of head: No  preauricular or posterior auricular adenopathy.     Left side of head: No preauricular or posterior auricular adenopathy.     Cervical: No cervical adenopathy.     Upper Body:     Right upper body: No supraclavicular or axillary adenopathy.     Left upper body: No supraclavicular or axillary adenopathy.     Lower Body: No right inguinal adenopathy. No left inguinal adenopathy.  Skin:    General: Skin is warm and dry.     Findings: No rash.  Neurological:     Mental Status: She is alert and oriented to person, place, and time.       Assessment and Plan:  Jasmine Blankenship is a 38 y.o. female presenting [facility] the [facility] for STI screening  1. Screening examination for STD (sexually transmitted disease) Await STD results  - Chlamydia/Gonorrhea [facility] - [facility] - HIV [place] [facility] - Multiple Vitamins-Minerals (MULTIVITAMIN) tablet; Take 1 tablet by mouth daily.  Dispense: 100 tablet; Refill: 0   2. Vaginal discharge  - WET PREP FOR TRICH, YEAST, CLUE - provider to review - Pregnancy, urine   Please treat client for BV per standing order - metroNIDAZOLE (FLAGYL) 500 MG tablet; Take 1 tablet (  500 mg total) by mouth 2 (two) times daily for 7 days.  Dispense: 14 tablet; Refill: 0  3. Bacterial vaginosis Please treat wet mount for BV per standing order - metroNIDAZOLE (FLAGYL) 500 MG tablet; Take 1 tablet (500 mg total) by mouth 2 (two) times daily for 7 days.  Dispense: 14 tablet; Refill: 0     Return if symptoms worsen or fail to improve.  No future appointments.  Berniece Andreas, NP

## 2019-02-15 NOTE — Progress Notes (Signed)
Patient wet mount reviewed, patient treated for BV per standing orders.Jenetta Downer, RN

## 2019-03-22 ENCOUNTER — Encounter: Payer: Self-pay | Admitting: Physician Assistant

## 2019-03-22 ENCOUNTER — Other Ambulatory Visit: Payer: Self-pay

## 2019-03-22 ENCOUNTER — Ambulatory Visit: Payer: Self-pay | Admitting: Physician Assistant

## 2019-03-22 DIAGNOSIS — Z113 Encounter for screening for infections with a predominantly sexual mode of transmission: Secondary | ICD-10-CM

## 2019-03-22 LAB — WET PREP FOR TRICH, YEAST, CLUE
Clue Cell Exam: POSITIVE — AB
Trichomonas Exam: NEGATIVE
Yeast Exam: NEGATIVE

## 2019-03-22 NOTE — Progress Notes (Signed)
    STI clinic/screening visit  Subjective:  Jasmine Blankenship is a 37 y.o. female being seen today for an STI screening visit. The patient reports they do have symptoms.  Patient has the following medical conditions:  There are no active problems to display for this patient.    Chief Complaint  Patient presents with  . SEXUALLY TRANSMITTED DISEASE    HPI  Patient reports that she has had urinary frequency and dysuria for about 4 days.  States that she has also started spotting and thinks her period is starting.  States that she has irregular periods, about every 4 months.  Has been having some cramping and taking OTC analgesic to relieve cramping.  Per record review, patient was treated for pyleonephritis 2 months ago.    See flowsheet for further details and programmatic requirements.    The following portions of the patient's history were reviewed and updated as appropriate: allergies, current medications, past medical history, past social history, past surgical history and problem list.  Objective:  There were no vitals filed for this visit.  Physical Exam Constitutional:      General: She is not in acute distress.    Appearance: Normal appearance.  HENT:     Head: Normocephalic and atraumatic.     Mouth/Throat:     Mouth: Mucous membranes are moist.     Pharynx: Oropharynx is clear. No oropharyngeal exudate or posterior oropharyngeal erythema.  Eyes:     Conjunctiva/sclera: Conjunctivae normal.  Neck:     Musculoskeletal: Neck supple.  Pulmonary:     Effort: Pulmonary effort is normal.  Abdominal:     Palpations: Abdomen is soft. There is no mass.     Tenderness: There is no abdominal tenderness. There is no guarding or rebound.  Genitourinary:    General: Normal vulva.     Rectum: Normal.     Comments: External genitalia/pubic area without nits, lice, edema, erythema, lesions or inguinal adenopathy. Vagina with normal mucosa and moderate amount of menstrual  bleeding. Cervix without visible lesions. Uterus firm, mobile, nt, no masses, no CMT, no adnexal tenderness or fullness. Lymphadenopathy:     Cervical: No cervical adenopathy.  Skin:    General: Skin is warm and dry.     Findings: No bruising, erythema, lesion or rash.  Neurological:     Mental Status: She is alert and oriented to person, place, and time.  Psychiatric:        Mood and Affect: Mood normal.        Behavior: Behavior normal.        Thought Content: Thought content normal.        Judgment: Judgment normal.       Assessment and Plan:  Jasmine Blankenship is a 38 y.o. female presenting to the [facility] for STI screening  1. Screening for STD (sexually transmitted disease) Patient with urinary symptoms today.  Screening tests for STDs completed. Rec condoms with all sex Await test results.  Counseled that RN will call if needs to RTC for any treatment once results are back. Counseled patient to drink fluids, especially water, cranberry juice, and lemonade. Counseled patient to follow up with PCP if symptoms persist. - WET PREP FOR TRICH, YEAST, CLUE - Chlamydia/Gonorrhea [place] [facility] - HIV [place] [facility] - Syphilis Serology, [place] [facility]     No follow-ups on file.  No future appointments.  Jerene Dilling, PA

## 2019-06-13 ENCOUNTER — Encounter: Payer: Self-pay | Admitting: Family Medicine

## 2019-06-13 ENCOUNTER — Other Ambulatory Visit: Payer: Self-pay | Admitting: Family Medicine

## 2019-06-13 ENCOUNTER — Ambulatory Visit: Payer: Self-pay | Admitting: Family Medicine

## 2019-06-13 ENCOUNTER — Other Ambulatory Visit: Payer: Self-pay

## 2019-06-13 DIAGNOSIS — Z113 Encounter for screening for infections with a predominantly sexual mode of transmission: Secondary | ICD-10-CM

## 2019-06-13 LAB — WET PREP FOR TRICH, YEAST, CLUE
Trichomonas Exam: NEGATIVE
Yeast Exam: NEGATIVE

## 2019-06-13 NOTE — Progress Notes (Signed)
Wet mount results reviewed. Per standing orders no treatment indicated. Avriel Kandel, RN  

## 2019-06-18 LAB — GONOCOCCUS CULTURE

## 2019-06-20 LAB — HM HEPATITIS C SCREENING LAB: HM Hepatitis Screen: NEGATIVE

## 2019-06-20 LAB — HM HIV SCREENING LAB: HM HIV Screening: NEGATIVE

## 2019-06-20 NOTE — Progress Notes (Signed)
  [facility] STI clinic/screening visit  Subjective:  Jasmine Blankenship is a 38 y.o. female being seen today for an STI screening visit. The patient reports they do have symptoms.  Patient reports that they do not desire a pregnancy in the next year.   They reported they are not interested in discussing contraception today.  No LMP recorded (approximate). (Menstrual status: Irregular Periods).   Patient has the following medical conditions:  There are no problems to display for this patient.   Chief Complaint  Patient presents with  . SEXUALLY TRANSMITTED DISEASE    HPI  Patient reports she has had 2 days of mild genital itching and  had a discharge a week ago with odor.  She is here for testing for yeast or BV.  See flowsheet for further details and programmatic requirements.   The following portions of the patient's history were reviewed and updated as appropriate: allergies, current medications, past medical history, past social history, past surgical history and problem list.  Objective:  There were no vitals filed for this visit.  Physical Exam Constitutional:      Appearance: Normal appearance.  HENT:     Mouth/Throat:     Pharynx: Oropharynx is clear. No oropharyngeal exudate or posterior oropharyngeal erythema.  Abdominal:     Palpations: There is no mass.     Tenderness: There is no abdominal tenderness.  Genitourinary:    General: Normal vulva.     Vagina: Vaginal discharge present.     Comments: sm amount of white disch, pH <4.5, no odor detected Bimanual not indicated Musculoskeletal:     Cervical back: No tenderness.  Lymphadenopathy:     Cervical: No cervical adenopathy.  Skin:    General: Skin is warm.     Findings: No lesion or rash.  Psychiatric:        Mood and Affect: Mood normal.      Assessment and Plan:  Jasmine Blankenship is a 38 y.o. female presenting to the [facility] for STI screening  1. Screening  examination for venereal disease  - HIV/HCV [place] [facility] - Syphilis Serology, [place] [facility] - Chlamydia/Gonorrhea [place] [facility] - WET PREP FOR TRICH, YEAST, CLUE  Wet prep -negative  No follow-ups on file.  No future appointments.  Hassell Done, FNP

## 2019-07-30 ENCOUNTER — Other Ambulatory Visit: Payer: Self-pay

## 2019-07-30 ENCOUNTER — Emergency Department
Admission: EM | Admit: 2019-07-30 | Discharge: 2019-07-30 | Disposition: A | Discharge status: Home / Self Care | Payer: Self-pay | Attending: Emergency Medicine | Admitting: Emergency Medicine

## 2019-07-30 DIAGNOSIS — R358 Other polyuria: Secondary | ICD-10-CM | POA: Insufficient documentation

## 2019-07-30 DIAGNOSIS — F17210 Nicotine dependence, cigarettes, uncomplicated: Secondary | ICD-10-CM | POA: Insufficient documentation

## 2019-07-30 DIAGNOSIS — R300 Dysuria: Secondary | ICD-10-CM | POA: Insufficient documentation

## 2019-07-30 DIAGNOSIS — M545 Low back pain: Secondary | ICD-10-CM | POA: Insufficient documentation

## 2019-07-30 DIAGNOSIS — Z79899 Other long term (current) drug therapy: Secondary | ICD-10-CM | POA: Insufficient documentation

## 2019-07-30 DIAGNOSIS — N898 Other specified noninflammatory disorders of vagina: Secondary | ICD-10-CM | POA: Insufficient documentation

## 2019-07-30 LAB — URINALYSIS, COMPLETE (UACMP) WITH MICROSCOPIC
Bacteria, UA: NONE SEEN
Bilirubin Urine: NEGATIVE
Glucose, UA: NEGATIVE mg/dL
Hgb urine dipstick: NEGATIVE
Ketones, ur: NEGATIVE mg/dL
Leukocytes,Ua: NEGATIVE
Nitrite: NEGATIVE
Protein, ur: NEGATIVE mg/dL
Specific Gravity, Urine: 1.016 (ref 1.005–1.030)
pH: 5 (ref 5.0–8.0)

## 2019-07-30 LAB — WET PREP, GENITAL
Clue Cells Wet Prep HPF POC: NONE SEEN
Sperm: NONE SEEN
Trich, Wet Prep: NONE SEEN
Yeast Wet Prep HPF POC: NONE SEEN

## 2019-07-30 LAB — POC URINE PREG, ED: Preg Test, Ur: NEGATIVE

## 2019-07-30 MED ORDER — CEFTRIAXONE SODIUM 250 MG IJ SOLR
250.0000 mg | Freq: Once | INTRAMUSCULAR | Status: AC
  Administered 2019-07-30: 250 mg via INTRAMUSCULAR
  Filled 2019-07-30: qty 250

## 2019-07-30 MED ORDER — AZITHROMYCIN 500 MG PO TABS
1000.0000 mg | ORAL_TABLET | Freq: Once | ORAL | Status: AC
  Administered 2019-07-30: 1000 mg via ORAL
  Filled 2019-07-30: qty 2

## 2019-07-30 NOTE — Discharge Instructions (Addendum)
Follow-up with [facility] department as needed.  Return emergency department if worsening.  You may want to try a vaginal insert of boric acid.  This is found in the female products area.  Your wet prep is not showing that you have bacterial vaginosis at this time.

## 2019-07-30 NOTE — ED Triage Notes (Signed)
Pt presents via POV c/o dysuria since Tuesday and left flank pain x2 days.

## 2019-07-30 NOTE — ED Notes (Signed)
Pt reports having lower back pain and dysuria. Pt states that she has also had some polyuria and vaginal itching with white discharge.

## 2019-07-30 NOTE — ED Provider Notes (Signed)
[facility] Emergency Department Provider Note  ____________________________________________   First MD Initiated Contact with Patient 07/30/19 1357     (approximate)  I have reviewed the triage vital signs and the nursing notes.   HISTORY  Chief Complaint Dysuria    HPI Jasmine Blankenship is a 39 y.o. female presents emergency department complaining of dysuria.  Some some lower back pain.  Some vaginal discharge.  States she has a vaginal odor that smells fishy.  She denies fever or chills.  She denies abdominal pain.  She denies dumping or diarrhea.    Past Medical History:  Diagnosis Date  . Depression   . Herpes     There are no problems to display for this patient.   Past Surgical History:  Procedure Laterality Date  . DILATION AND CURETTAGE OF UTERUS  2018    Prior to Admission medications   Medication Sig Start Date End Date Taking? Authorizing Provider  cephALEXin (KEFLEX) 500 MG capsule Take 1 capsule (500 mg total) by mouth 3 (three) times daily. Patient not taking: Reported on 06/13/2019 01/22/19   Fritzi Mandes, MD  Multiple Vitamin (MULTIVITAMIN WITH MINERALS) TABS tablet Take 1 tablet by mouth daily.    [provider]  Multiple Vitamins-Minerals (MULTIVITAMIN) tablet Take 1 tablet by mouth daily. Patient not taking: Reported on 06/13/2019 02/15/19   Caren Macadam, MD    Allergies Patient has no known allergies.  Family History  Problem Relation Age of Onset  . Hypertension Mother   . Sickle cell trait Mother   . HIV Father   . Schizophrenia Sister   . Bipolar disorder Sister   . Hypertension Sister   . Stroke Maternal Grandmother   . Diabetes Paternal Grandmother     Social History Social History   Tobacco Use  . Smoking status: Current Every Day Smoker    Packs/day: 0.50    Years: 14.00    Pack years: 7.00    Types: Cigarettes  . Smokeless tobacco: Never Used  Substance Use Topics  . Alcohol use:  Yes    Comment: drinks daily, pint  . Drug use: Yes    Types: Marijuana    Comment: once/month    Review of Systems  Constitutional: No fever/chills Eyes: No visual changes. ENT: No sore throat. Respiratory: Denies cough Cardiovascular: Denies chest pain Gastrointestinal: Denies abdominal pain Genitourinary: Positive for dysuria.  And vaginal discharge Musculoskeletal: Negative for back pain. Skin: Negative for rash. Psychiatric: no mood changes,     ____________________________________________   PHYSICAL EXAM:  VITAL SIGNS: ED Triage Vitals [07/30/19 1331]  Enc Vitals Group     BP 132/85     Pulse Rate 76     Resp 14     Temp 98.3 F (36.8 C)     Temp src      SpO2 98 %     Weight 130 lb (59 kg)     Height      Head Circumference      Peak Flow      Pain Score 8     Pain Loc      Pain Edu?      Excl. in [place]?     Constitutional: Alert and oriented. Well appearing and in no acute distress. Eyes: Conjunctivae are normal.  Head: Atraumatic. Nose: No congestion/rhinnorhea. Mouth/Throat: Mucous membranes are moist.   Neck:  supple no lymphadenopathy noted Cardiovascular: Normal rate, regular rhythm. Respiratory: Normal respiratory effort.  No retractions,  GU: External vaginal exam does not show any lesions, internal exam shows white watery discharge, no cervical tenderness Musculoskeletal: FROM all extremities, warm and well perfused Neurologic:  Normal speech and language.  Skin:  Skin is warm, dry and intact. No rash noted. Psychiatric: Mood and affect are normal. Speech and behavior are normal.  ____________________________________________   LABS (all labs ordered are listed, but only abnormal results are displayed)  Labs Reviewed  WET PREP, GENITAL - Abnormal; Notable for the following components:      Result Value   WBC, Wet Prep HPF POC MANY (*)    All other components within normal limits  URINALYSIS, COMPLETE (UACMP) WITH MICROSCOPIC -  Abnormal; Notable for the following components:   Color, Urine YELLOW (*)    APPearance CLEAR (*)    All other components within normal limits  GC/CHLAMYDIA PROBE AMP  POC URINE PREG, ED   ____________________________________________   ____________________________________________  RADIOLOGY    ____________________________________________   PROCEDURES  Procedure(s) performed: Rocephin 250 mg IM, Zithromax 1 g p.o.   Procedures    ____________________________________________   INITIAL IMPRESSION / ASSESSMENT AND PLAN / ED COURSE  Pertinent labs & imaging results that were available during my care of the patient were reviewed by me and considered in my medical decision making (see chart for details).   Patient is a 40 year old female presents emergency department with dysuria and vaginal discharge.  See HPI  Physical exam does show some vaginal discharge  Patient was given Rocephin 250 mg IM, Zithromax 1 g p.o. Wet prep has many WBCs UA is normal, POC pregnancy negative GC/chlamydia is pending  Explained findings to the patient.  She is to follow-up with [facility] department.  Return if worsening.    Jasmine Blankenship was evaluated in Emergency Department on 07/30/2019 for the symptoms described in the history of present illness. She was evaluated in the context of the global COVID-19 pandemic, which necessitated consideration that the patient might be at risk for infection with the SARS-CoV-2 virus that causes COVID-19. Institutional protocols and algorithms that pertain to the evaluation of patients at risk for COVID-19 are in a state of rapid change based on information released by regulatory bodies including the CDC and federal and state organizations. These policies and algorithms were followed during the patient's care in the ED.   As part of my medical decision making, I reviewed the following data within the electronic MEDICAL RECORD NUMBER Nursing notes  reviewed and incorporated, Labs reviewed see above, Old chart reviewed, Notes from prior ED visits and [place] Controlled Substance Database  ____________________________________________   FINAL CLINICAL IMPRESSION(S) / ED DIAGNOSES  Final diagnoses:  Dysuria  Vaginal discharge      NEW MEDICATIONS STARTED DURING THIS VISIT:  New Prescriptions   No medications on file     Note:  This document was prepared using Dragon voice recognition software and may include unintentional dictation errors.    Faythe Ghee, PA-C 07/30/19 1613    Concha Se, MD 07/31/19 [phone]

## 2019-08-02 LAB — GC/CHLAMYDIA PROBE AMP
Chlamydia trachomatis, NAA: NEGATIVE
Neisseria Gonorrhoeae by PCR: NEGATIVE

## 2019-09-11 ENCOUNTER — Emergency Department: Payer: Self-pay

## 2019-09-11 ENCOUNTER — Other Ambulatory Visit: Payer: Self-pay

## 2019-09-11 ENCOUNTER — Encounter: Payer: Self-pay | Admitting: *Deleted

## 2019-09-11 ENCOUNTER — Emergency Department
Admission: EM | Admit: 2019-09-11 | Discharge: 2019-09-11 | Disposition: A | Discharge status: Home / Self Care | Payer: Self-pay | Attending: Student | Admitting: Student

## 2019-09-11 DIAGNOSIS — F17210 Nicotine dependence, cigarettes, uncomplicated: Secondary | ICD-10-CM | POA: Insufficient documentation

## 2019-09-11 DIAGNOSIS — R079 Chest pain, unspecified: Secondary | ICD-10-CM | POA: Insufficient documentation

## 2019-09-11 DIAGNOSIS — R55 Syncope and collapse: Secondary | ICD-10-CM | POA: Insufficient documentation

## 2019-09-11 LAB — URINALYSIS, COMPLETE (UACMP) WITH MICROSCOPIC
Bacteria, UA: NONE SEEN
Bilirubin Urine: NEGATIVE
Glucose, UA: 50 mg/dL — AB
Ketones, ur: NEGATIVE mg/dL
Leukocytes,Ua: NEGATIVE
Nitrite: NEGATIVE
Protein, ur: 100 mg/dL — AB
Specific Gravity, Urine: 1.021 (ref 1.005–1.030)
pH: 5 (ref 5.0–8.0)

## 2019-09-11 LAB — CBC
HCT: 40.9 % (ref 36.0–46.0)
Hemoglobin: 13.2 g/dL (ref 12.0–15.0)
MCH: 28.4 pg (ref 26.0–34.0)
MCHC: 32.3 g/dL (ref 30.0–36.0)
MCV: 88.1 fL (ref 80.0–100.0)
Platelets: 220 10*3/uL (ref 150–400)
RBC: 4.64 MIL/uL (ref 3.87–5.11)
RDW: 14.6 % (ref 11.5–15.5)
WBC: 11.1 10*3/uL — ABNORMAL HIGH (ref 4.0–10.5)
nRBC: 0 % (ref 0.0–0.2)

## 2019-09-11 LAB — URINE DRUG SCREEN, QUALITATIVE (ARMC ONLY)
Amphetamines, Ur Screen: NOT DETECTED
Barbiturates, Ur Screen: NOT DETECTED
Benzodiazepine, Ur Scrn: NOT DETECTED
Cannabinoid 50 Ng, Ur ~~LOC~~: NOT DETECTED
Cocaine Metabolite,Ur ~~LOC~~: NOT DETECTED
MDMA (Ecstasy)Ur Screen: NOT DETECTED
Methadone Scn, Ur: NOT DETECTED
Opiate, Ur Screen: NOT DETECTED
Phencyclidine (PCP) Ur S: NOT DETECTED
Tricyclic, Ur Screen: NOT DETECTED

## 2019-09-11 LAB — BASIC METABOLIC PANEL
Anion gap: 6 (ref 5–15)
BUN: 16 mg/dL (ref 6–20)
CO2: 24 mmol/L (ref 22–32)
Calcium: 8.4 mg/dL — ABNORMAL LOW (ref 8.9–10.3)
Chloride: 107 mmol/L (ref 98–111)
Creatinine, Ser: 0.94 mg/dL (ref 0.44–1.00)
GFR calc Af Amer: >60 mL/min (ref >60)
GFR calc non Af Amer: >60 mL/min (ref >60)
Glucose, Bld: 150 mg/dL — ABNORMAL HIGH (ref 70–99)
Potassium: 3.6 mmol/L (ref 3.5–5.1)
Sodium: 137 mmol/L (ref 135–145)

## 2019-09-11 LAB — TROPONIN I (HIGH SENSITIVITY): Troponin I (High Sensitivity): <2 ng/L (ref <18)

## 2019-09-11 LAB — POCT PREGNANCY, URINE: Preg Test, Ur: NEGATIVE

## 2019-09-11 MED ORDER — SODIUM CHLORIDE 0.9% FLUSH: 3.0000 mL | Freq: Once | INTRAVENOUS | Status: DC

## 2019-09-11 MED ORDER — IOHEXOL 350 MG/ML SOLN
75.0000 mL | Freq: Once | INTRAVENOUS | Status: AC | PRN
  Administered 2019-09-11: 17:00:00 75 mL via INTRAVENOUS
  Filled 2019-09-11: qty 75

## 2019-09-11 NOTE — ED Provider Notes (Addendum)
[facility] Emergency Department Provider Note  ____________________________________________   First MD Initiated Contact with Patient 09/11/19 1635     (approximate)  I have reviewed the triage vital signs and the nursing notes.  History  Chief Complaint Near Syncope    HPI Jasmine Blankenship is a 39 y.o. female past medical history as below, who presents to the emergency department for a near syncopal episode.  Patient states she was working at [employer], stepped out for a cigarette break.  She smoked a cigarette extra quickly so she could get back to work and help her colleague.  Upon coming back inside she began to feel unwell, got tunnel vision and spots in her vision, palpitations, chest pain, shortness of breath. This lasted a few minutes.  She felt presyncopal and sat down.  She denies ever completely losing consciousness.  Per triage notes, on EMS arrival she was initially lethargic and apparently slow to respond to questions. By my exam, she is alert and oriented, denies any headache, chest pain, difficulty breathing.  She does report some mild menstrual cramping as she recently started her menses, which is normal for her and unchanged.  She also reports when going to give a urine sample, she noted she had some loose stool in her underwear, and does not know when that occurred.  She is unsure if anyone described any seizure-like activity during this episode.  She has no history of seizures.  No history of DVT, PE, VTE.  No history of arrhythmias.   Past Medical Hx Past Medical History:  Diagnosis Date  . Depression   . Herpes     Problem List There are no problems to display for this patient.   Past Surgical Hx Past Surgical History:  Procedure Laterality Date  . DILATION AND CURETTAGE OF UTERUS  2018    Medications Prior to Admission medications   Medication Sig Start Date End Date Taking? Authorizing Provider  cephALEXin (KEFLEX) 500 MG capsule  Take 1 capsule (500 mg total) by mouth 3 (three) times daily. Patient not taking: Reported on 06/13/2019 01/22/19   Enedina Finner, MD  Multiple Vitamin (MULTIVITAMIN WITH MINERALS) TABS tablet Take 1 tablet by mouth daily.    [provider]  Multiple Vitamins-Minerals (MULTIVITAMIN) tablet Take 1 tablet by mouth daily. Patient not taking: Reported on 06/13/2019 02/15/19   Federico Flake, MD    Allergies Patient has no known allergies.  Family Hx Family History  Problem Relation Age of Onset  . Hypertension Mother   . Sickle cell trait Mother   . HIV Father   . Schizophrenia Sister   . Bipolar disorder Sister   . Hypertension Sister   . Stroke Maternal Grandmother   . Diabetes Paternal Grandmother     Social Hx Social History   Tobacco Use  . Smoking status: Current Every Day Smoker    Packs/day: 0.50    Years: 14.00    Pack years: 7.00    Types: Cigarettes  . Smokeless tobacco: Never Used  Substance Use Topics  . Alcohol use: Yes    Comment: drinks daily, pint  . Drug use: Yes    Types: Marijuana    Comment: once/month     Review of Systems  Constitutional: Negative for fever, chills. + near syncope Eyes: Negative for visual changes. ENT: Negative for sore throat. Cardiovascular: + for chest pain, palpitations. Respiratory: + for shortness of breath. Gastrointestinal: Negative for nausea, vomiting.  Genitourinary: Negative for  dysuria. Musculoskeletal: Negative for leg swelling. Skin: Negative for rash. Neurological: Negative for headaches.   Physical Exam  Vital Signs: ED Triage Vitals  Enc Vitals Group     BP 09/11/19 1527 105/65     Pulse Rate 09/11/19 1527 91     Resp 09/11/19 1527 20     Temp 09/11/19 1527 98.2 F (36.8 C)     Temp Source 09/11/19 1527 Oral     SpO2 09/11/19 1527 100 %     Weight 09/11/19 1528 130 lb (59 kg)     Height 09/11/19 1528 5\' 9"  (1.753 m)     Head Circumference --      Peak Flow --      Pain Score  09/11/19 1527 8     Pain Loc --      Pain Edu? --      Excl. in [place]? --     Constitutional: Alert and oriented. Well appearing. Thin.  Head: Normocephalic. Atraumatic. Eyes: Conjunctivae clear, sclera anicteric. Pupils equal and symmetric. Nose: No masses or lesions. No congestion or rhinorrhea. Mouth/Throat: Wearing mask.  Neck: No stridor. Trachea midline.  Cardiovascular: Normal rate, regular rhythm. Extremities well perfused. Respiratory: Normal respiratory effort.  Lungs CTAB. Gastrointestinal: Soft. Non-distended. Non-tender.  Genitourinary: Deferred. Musculoskeletal: No lower extremity edema. No deformities. Neurologic:  Normal speech and language. No gross focal or lateralizing neurologic deficits are appreciated. Alert and oriented.  Face symmetric.  Tongue midline.  Cranial nerves II through XII intact. UE and LE strength 5/5 and symmetric. UE and LE SILT.  Skin: Skin is warm, dry and intact. No rash noted. Psychiatric: Mood and affect are appropriate for situation.  EKG  Personally reviewed and interpreted by myself.   Rate: 89 Rhythm: sinus Axis: right Intervals: WNL No evidence of Brugada, WPW, or Wolff-Parkinson-White Right atrial enlargement RVH Abnormal EKG, though appears similar to prior No STEMI    Radiology  CT head: IMPRESSION:  Normal head CT for age.   CT PE:  IMPRESSION:  No evidence of pulmonary emboli.  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  No other focal abnormality is noted.    Procedures  Procedure(s) performed (including critical care):  Procedures   Initial Impression / Assessment and Plan / MDM / ED Course  39 y.o. female who presents to the ED for an episode of near syncope, as above.   Ddx: arrhythmia, PE, seizure (unclear if seizure activity occurred, but did note some stool in her underwear after event), electrolyte abnormality, transient hypoxia/side effects  relating to quick smoking of a cigarette  Will evaluate with labs, imaging, EKG, urine studies.  EKG as above, abnormal, but appears similar to prior.  Troponin negative.  Electrolytes without actionable derangements.  UA negative for infection.  Negative urine pregnancy.  CT head negative.  CT chest negative for PE.  Given negative work-up, feel patient is stable for discharge with outpatient follow-up.  Will give information for PCP follow-up as well as information for Neurology.  She voices understanding and is comfortable to plan and discharge.  Given return precautions.  _______________________________  As part of my medical decision making I have reviewed available labs, radiology tests, reviewed old records.  Final Clinical Impression(s) / ED Diagnosis  Final diagnoses:  Near syncope       Note:  This document was prepared using Dragon voice recognition software and may include unintentional dictation errors.     Derrell Lolling  L., MD 09/11/19 1813

## 2019-09-11 NOTE — ED Triage Notes (Signed)
Pt from work via [employer]. Per EMS, patient was complaining of weakness and dizziness this morning. States that around 2, she went into dining room at work and had syncopal episode at table and was hard to arouse. Patient very lethargic and slow to respond to questions for EMS. Able to give name and birthday.

## 2019-09-11 NOTE — ED Notes (Signed)
This note is not being shared with the patient for the following reason: To prevent harm (release of this note would result in harm to the life or physical safety of the patient or another).   Per paramedic, while he was sittring in lobby with patient waiting to give report, someone came up to patient, calling her by name, asking her repeatedly what she took. Patient would not respond to person. Visitor then seen exiting to parking lot stating "I'm going to call your momma."

## 2019-09-11 NOTE — Discharge Instructions (Signed)
Thank you for letting us take care of you in the emergency department today.   Please continue to take any regular, prescribed medications.  Please follow up with: A primary care doctor to review your ER visit and follow up on your symptoms.  Information for 2 PCP options are below. Neurology doctor, information below as well.   Please return to the ER for any new or worsening symptoms.

## 2019-09-11 NOTE — ED Triage Notes (Signed)
Pt brought in via ems from [employer].  Pt had a near syncope episode today.  Pt has abd pain on arrival to triage.  No n/v/d  Iv in place.  Pt alert speech clear.

## 2019-11-20 ENCOUNTER — Encounter: Payer: Self-pay | Admitting: Emergency Medicine

## 2019-11-20 ENCOUNTER — Other Ambulatory Visit: Payer: Self-pay

## 2019-11-20 DIAGNOSIS — N760 Acute vaginitis: Secondary | ICD-10-CM | POA: Insufficient documentation

## 2019-11-20 DIAGNOSIS — Z79899 Other long term (current) drug therapy: Secondary | ICD-10-CM | POA: Insufficient documentation

## 2019-11-20 DIAGNOSIS — B9689 Other specified bacterial agents as the cause of diseases classified elsewhere: Secondary | ICD-10-CM | POA: Insufficient documentation

## 2019-11-20 DIAGNOSIS — F17210 Nicotine dependence, cigarettes, uncomplicated: Secondary | ICD-10-CM | POA: Insufficient documentation

## 2019-11-20 DIAGNOSIS — R300 Dysuria: Secondary | ICD-10-CM | POA: Insufficient documentation

## 2019-11-20 LAB — URINALYSIS, COMPLETE (UACMP) WITH MICROSCOPIC
Bacteria, UA: NONE SEEN
Bilirubin Urine: NEGATIVE
Glucose, UA: NEGATIVE mg/dL
Hgb urine dipstick: NEGATIVE
Ketones, ur: NEGATIVE mg/dL
Leukocytes,Ua: NEGATIVE
Nitrite: NEGATIVE
Protein, ur: 30 mg/dL — AB
Specific Gravity, Urine: 1.025 (ref 1.005–1.030)
pH: 5 (ref 5.0–8.0)

## 2019-11-20 LAB — POCT PREGNANCY, URINE: Preg Test, Ur: NEGATIVE

## 2019-11-20 NOTE — ED Triage Notes (Signed)
Pt presents to ED with increased vaginal odor and discharge and lower back pain with dysuria. symptoms have been ongoing for over a week. Made appt at health dept but it is not until next monday and pt doesn't want to wait that long.

## 2019-11-21 ENCOUNTER — Emergency Department
Admission: EM | Admit: 2019-11-21 | Discharge: 2019-11-21 | Disposition: A | Discharge status: Home / Self Care | Payer: Self-pay | Attending: Emergency Medicine | Admitting: Emergency Medicine

## 2019-11-21 DIAGNOSIS — N760 Acute vaginitis: Secondary | ICD-10-CM

## 2019-11-21 DIAGNOSIS — R300 Dysuria: Secondary | ICD-10-CM

## 2019-11-21 LAB — CHLAMYDIA/NGC RT PCR (ARMC ONLY)
Chlamydia Tr: NOT DETECTED
N gonorrhoeae: NOT DETECTED

## 2019-11-21 LAB — WET PREP, GENITAL
Sperm: NONE SEEN
Trich, Wet Prep: NONE SEEN
WBC, Wet Prep HPF POC: NONE SEEN
Yeast Wet Prep HPF POC: NONE SEEN

## 2019-11-21 MED ORDER — CEFTRIAXONE SODIUM 250 MG IJ SOLR
250.0000 mg | Freq: Once | INTRAMUSCULAR | Status: AC
  Administered 2019-11-21: 250 mg via INTRAMUSCULAR
  Filled 2019-11-21: qty 250

## 2019-11-21 MED ORDER — ONDANSETRON 4 MG PO TBDP
4.0000 mg | ORAL_TABLET | Freq: Once | ORAL | Status: AC
  Administered 2019-11-21: 4 mg via ORAL
  Filled 2019-11-21: qty 1

## 2019-11-21 MED ORDER — FLUCONAZOLE 150 MG PO TABS: 150.0000 mg | ORAL_TABLET | Freq: Once | ORAL | 0 refills | Status: AC

## 2019-11-21 MED ORDER — METRONIDAZOLE 500 MG PO TABS
2000.0000 mg | ORAL_TABLET | Freq: Once | ORAL | Status: AC
  Administered 2019-11-21: 2000 mg via ORAL
  Filled 2019-11-21: qty 4

## 2019-11-21 MED ORDER — AZITHROMYCIN 500 MG PO TABS
1000.0000 mg | ORAL_TABLET | Freq: Once | ORAL | Status: AC
  Administered 2019-11-21: 1000 mg via ORAL
  Filled 2019-11-21: qty 2

## 2019-11-21 MED ORDER — METRONIDAZOLE 500 MG PO TABS: 500.0000 mg | ORAL_TABLET | Freq: Two times a day (BID) | ORAL | 0 refills | Status: DC

## 2019-11-21 MED ORDER — PHENAZOPYRIDINE HCL 200 MG PO TABS: 200.0000 mg | ORAL_TABLET | Freq: Three times a day (TID) | ORAL | 0 refills | Status: DC | PRN

## 2019-11-21 NOTE — Discharge Instructions (Addendum)
You were treated with antibiotics for concern of STDs. Take Flagyl 500mg  twice daily x 7 days. Take Pyridium for urinary discomfort. Return to the ER for worsening symptoms, persistent vomiting, difficulty breathing or other concerns.

## 2019-11-21 NOTE — ED Notes (Signed)
Dr. Sung at the bedside for pt evaluation 

## 2019-11-21 NOTE — ED Provider Notes (Signed)
[facility] Emergency Department Provider Note   ____________________________________________   First MD Initiated Contact with Patient 11/21/19 0413     (approximate)  I have reviewed the triage vital signs and the nursing notes.   HISTORY  Chief Complaint Vaginal Discharge, Dysuria, and Back Pain    HPI Jasmine Blankenship is a 39 y.o. female who presents to the ED from home with a chief complaint of vaginal odor and discharge.  Symptoms x1 week.  Associated with lower back pain with dysuria.  Denies fever, abdominal pain, nausea, vomiting or diarrhea.  Expresses concern for STD.      Past Medical History:  Diagnosis Date  . Depression   . Herpes     There are no problems to display for this patient.   Past Surgical History:  Procedure Laterality Date  . DILATION AND CURETTAGE OF UTERUS  2018    Prior to Admission medications   Medication Sig Start Date End Date Taking? Authorizing Provider  cephALEXin (KEFLEX) 500 MG capsule Take 1 capsule (500 mg total) by mouth 3 (three) times daily. Patient not taking: Reported on 06/13/2019 01/22/19   Enedina Finner, MD  fluconazole (DIFLUCAN) 150 MG tablet Take 1 tablet (150 mg total) by mouth once for 1 dose. 11/21/19 11/21/19  Irean Hong, MD  metroNIDAZOLE (FLAGYL) 500 MG tablet Take 1 tablet (500 mg total) by mouth 2 (two) times daily. 11/21/19   Irean Hong, MD  Multiple Vitamin (MULTIVITAMIN WITH MINERALS) TABS tablet Take 1 tablet by mouth daily.    [provider]  Multiple Vitamins-Minerals (MULTIVITAMIN) tablet Take 1 tablet by mouth daily. Patient not taking: Reported on 06/13/2019 02/15/19   Federico Flake, MD  phenazopyridine (PYRIDIUM) 200 MG tablet Take 1 tablet (200 mg total) by mouth 3 (three) times daily as needed for pain. 11/21/19   Irean Hong, MD    Allergies Patient has no known allergies.  Family History  Problem Relation Age of Onset  . Hypertension Mother   . Sickle  cell trait Mother   . HIV Father   . Schizophrenia Sister   . Bipolar disorder Sister   . Hypertension Sister   . Stroke Maternal Grandmother   . Diabetes Paternal Grandmother     Social History Social History   Tobacco Use  . Smoking status: Current Every Day Smoker    Packs/day: 0.50    Years: 14.00    Pack years: 7.00    Types: Cigarettes  . Smokeless tobacco: Never Used  Substance Use Topics  . Alcohol use: Yes    Comment: drinks daily, pint  . Drug use: Yes    Types: Marijuana    Comment: once/month    Review of Systems  Constitutional: No fever/chills Eyes: No visual changes. ENT: No sore throat. Cardiovascular: Denies chest pain. Respiratory: Denies shortness of breath. Gastrointestinal: No abdominal pain.  No nausea, no vomiting.  No diarrhea.  No constipation. Genitourinary: Positive for vaginal odor, discharge and dysuria. Musculoskeletal: Negative for back pain. Skin: Negative for rash. Neurological: Negative for headaches, focal weakness or numbness.   ____________________________________________   PHYSICAL EXAM:  VITAL SIGNS: ED Triage Vitals  Enc Vitals Group     BP 11/20/19 2258 132/84     Pulse Rate 11/20/19 2258 73     Resp 11/20/19 2258 18     Temp 11/20/19 2258 98.5 F (36.9 C)     Temp Source 11/20/19 2258 Oral     SpO2  11/20/19 2258 100 %     Weight 11/20/19 2259 130 lb (59 kg)     Height 11/20/19 2259 5\' 10"  (1.778 m)     Head Circumference --      Peak Flow --      Pain Score 11/20/19 2259 7     Pain Loc --      Pain Edu? --      Excl. in [place]? --     Constitutional: Alert and oriented. Well appearing and in no acute distress. Eyes: Conjunctivae are normal. PERRL. EOMI. Head: Atraumatic. Nose: No congestion/rhinnorhea. Mouth/Throat: Mucous membranes are moist.  Oropharynx non-erythematous. Neck: No stridor.   Cardiovascular: Normal rate, regular rhythm. Grossly normal heart sounds.  Good peripheral circulation. Respiratory:  Normal respiratory effort.  No retractions. Lungs CTAB. Gastrointestinal: Soft and nontender. No distention. No abdominal bruits. No CVA tenderness. Pelvic: External exam unremarkable without rashes, lesions or vesicles.  Speculum exam reveals thick white discharge.  Cervical os is closed.  Bimanual exam unremarkable. Musculoskeletal: No lower extremity tenderness nor edema.  No joint effusions. Neurologic:  Normal speech and language. No gross focal neurologic deficits are appreciated. No gait instability. Skin:  Skin is warm, dry and intact. No rash noted. Psychiatric: Mood and affect are normal. Speech and behavior are normal.  ____________________________________________   LABS (all labs ordered are listed, but only abnormal results are displayed)  Labs Reviewed  WET PREP, GENITAL - Abnormal; Notable for the following components:      Result Value   Clue Cells Wet Prep HPF POC PRESENT (*)    All other components within normal limits  URINALYSIS, COMPLETE (UACMP) WITH MICROSCOPIC - Abnormal; Notable for the following components:   Color, Urine YELLOW (*)    APPearance CLEAR (*)    Protein, ur 30 (*)    All other components within normal limits  CHLAMYDIA/NGC RT PCR ([facility] ONLY)  POC URINE PREG, ED  POCT PREGNANCY, URINE   ____________________________________________  EKG  None ____________________________________________  RADIOLOGY  ED MD interpretation:  None  Official radiology report(s): No results found.  ____________________________________________   PROCEDURES  Procedure(s) performed (including Critical Care):  Procedures   ____________________________________________   INITIAL IMPRESSION / ASSESSMENT AND PLAN / ED COURSE  As part of my medical decision making, I reviewed the following data within the [place] notes reviewed and incorporated, Labs reviewed and Notes from prior ED visits     [name] was  evaluated in Emergency Department on 11/21/2019 for the symptoms described in the history of present illness. She was evaluated in the context of the global COVID-19 pandemic, which necessitated consideration that the patient might be at risk for infection with the SARS-CoV-2 virus that causes COVID-19. Institutional protocols and algorithms that pertain to the evaluation of patients at risk for COVID-19 are in a state of rapid change based on information released by regulatory bodies including the CDC and federal and state organizations. These policies and algorithms were followed during the patient's care in the ED.    39 year old female presenting with vaginal odor, discharge and dysuria.  UA unremarkable.  Will obtain wet prep and DNA swabs.  Treat empirically for STDs.   Clinical Course as of Nov 20 617  Tue Nov 21, 2019  9983 Wet prep reveals clue cells. Patient treated empirically for STD concerns. Will discharge home on Flagyl, Pyridium for dysuria, and she will follow up with GYN. Strict return precautions given. Patient verbalizes understanding and  agrees with plan of care.   [JS]    Clinical Course User Index [JS] Irean Hong, MD     ____________________________________________   FINAL CLINICAL IMPRESSION(S) / ED DIAGNOSES  Final diagnoses:  BV (bacterial vaginosis)  Dysuria     ED Discharge Orders         Ordered    metroNIDAZOLE (FLAGYL) 500 MG tablet  2 times daily     11/21/19 0452    phenazopyridine (PYRIDIUM) 200 MG tablet  3 times daily PRN     11/21/19 0452    fluconazole (DIFLUCAN) 150 MG tablet   Once     11/21/19 4469           Note:  This document was prepared using Dragon voice recognition software and may include unintentional dictation errors.   Irean Hong, MD 11/21/19 [phone]

## 2019-11-27 ENCOUNTER — Ambulatory Visit: Payer: Self-pay

## 2020-05-24 ENCOUNTER — Other Ambulatory Visit: Payer: Self-pay

## 2020-05-24 ENCOUNTER — Ambulatory Visit: Payer: Self-pay | Admitting: Physician Assistant

## 2020-05-24 DIAGNOSIS — Z299 Encounter for prophylactic measures, unspecified: Secondary | ICD-10-CM

## 2020-05-24 DIAGNOSIS — Z113 Encounter for screening for infections with a predominantly sexual mode of transmission: Secondary | ICD-10-CM

## 2020-05-24 LAB — WET PREP FOR TRICH, YEAST, CLUE
Trichomonas Exam: NEGATIVE
Yeast Exam: NEGATIVE

## 2020-05-24 MED ORDER — AZITHROMYCIN 500 MG PO TABS
1000.0000 mg | ORAL_TABLET | Freq: Once | ORAL | Status: AC
  Administered 2020-05-24: 1000 mg via ORAL

## 2020-05-24 NOTE — Progress Notes (Signed)
Post:  RN reviewed wet mount results with patient. Patient given 1 gram Azithromycin for symptoms per C. Bellingham PA. Provider orders complete.   Harvie Heck, RN

## 2020-05-25 ENCOUNTER — Encounter: Payer: Self-pay | Admitting: Physician Assistant

## 2020-05-25 NOTE — Progress Notes (Signed)
[facility] Department STI clinic/screening visit  Subjective:  Jasmine Blankenship is a 39 y.o. female being seen today for an STI screening visit. The patient reports they do have symptoms.  Patient reports that they do not desire a pregnancy in the next year.   They reported they are not interested in discussing contraception today.  No LMP recorded. (Menstrual status: Irregular Periods).   Patient has the following medical conditions:  There are no problems to display for this patient.   Chief Complaint  Patient presents with   SEXUALLY TRANSMITTED DISEASE    screening    HPI  Patient reports that she has had some vaginal irritation, a sore feeling and some discharge for about 2 weeks.  Denies other symptoms, chronic conditions, surgeries and regular medicines.  States last HIV test was in 2020 and that she also had a pap in 2020.  Reports that she has irregular periods and her last one was about 4 months ago.   See flowsheet for further details and programmatic requirements.    The following portions of the patient's history were reviewed and updated as appropriate: allergies, current medications, past medical history, past social history, past surgical history and problem list.  Objective:  There were no vitals filed for this visit.  Physical Exam Constitutional:      General: She is not in acute distress.    Appearance: Normal appearance.  HENT:     Head: Normocephalic and atraumatic.     Comments: No nits,lice, or hair loss. No cervical, supraclavicular or axillary adenopathy.    Mouth/Throat:     Mouth: Mucous membranes are moist.     Pharynx: Oropharynx is clear. No oropharyngeal exudate or posterior oropharyngeal erythema.  Eyes:     Conjunctiva/sclera: Conjunctivae normal.  Pulmonary:     Effort: Pulmonary effort is normal.  Abdominal:     Palpations: Abdomen is soft. There is no mass.     Tenderness: There is no abdominal tenderness. There is no  guarding or rebound.  Genitourinary:    General: Normal vulva.     Rectum: Normal.     Comments: External genitalia/pubic area without nits, lice, edema, erythema, lesions and inguinal adenopathy. Vagina with normal mucosa and discharge. Cervix without visible lesions,but has erythema around os and slight friability. Uterus firm, mobile, nt, no masses, no CMT, no adnexal tenderness or fullness. Musculoskeletal:     Cervical back: Neck supple. No tenderness.  Skin:    General: Skin is warm and dry.     Findings: No bruising, erythema, lesion or rash.  Neurological:     Mental Status: She is alert and oriented to person, place, and time.  Psychiatric:        Mood and Affect: Mood normal.        Behavior: Behavior normal.        Thought Content: Thought content normal.        Judgment: Judgment normal.      Assessment and Plan:  Jasmine Blankenship is a 39 y.o. female presenting to the [facility] Department for STI screening  1. Screening for STD (sexually transmitted disease) Patient into clinic with symptoms. Rec condoms with all sex. Await test results.  Counseled that RN will call if needs to RTC for treatment once results are back. - WET PREP FOR TRICH, YEAST, CLUE - Chlamydia/Gonorrhea [place] [facility] - HIV [place] [facility] - Syphilis Serology, [place] [facility]  2. Prophylactic measure Due to exam  findings, will treat for cervicitis with Azithromycin 1 g po DOT today. No sex for 7 days and until after her results are back. - azithromycin (ZITHROMAX) tablet 1,000 mg     No follow-ups on file.  No future appointments.  Matt Holmes, PA

## 2020-08-09 IMAGING — DX PORTABLE CHEST - 1 VIEW
2 series · 2 of 2 positions shown · non-contrast
Comparison: Prior radiograph from 08/09/2010

CLINICAL DATA: Initial evaluation for acute sepsis.

EXAM:
PORTABLE CHEST 1 VIEW

[chest ap (1 of 2)]
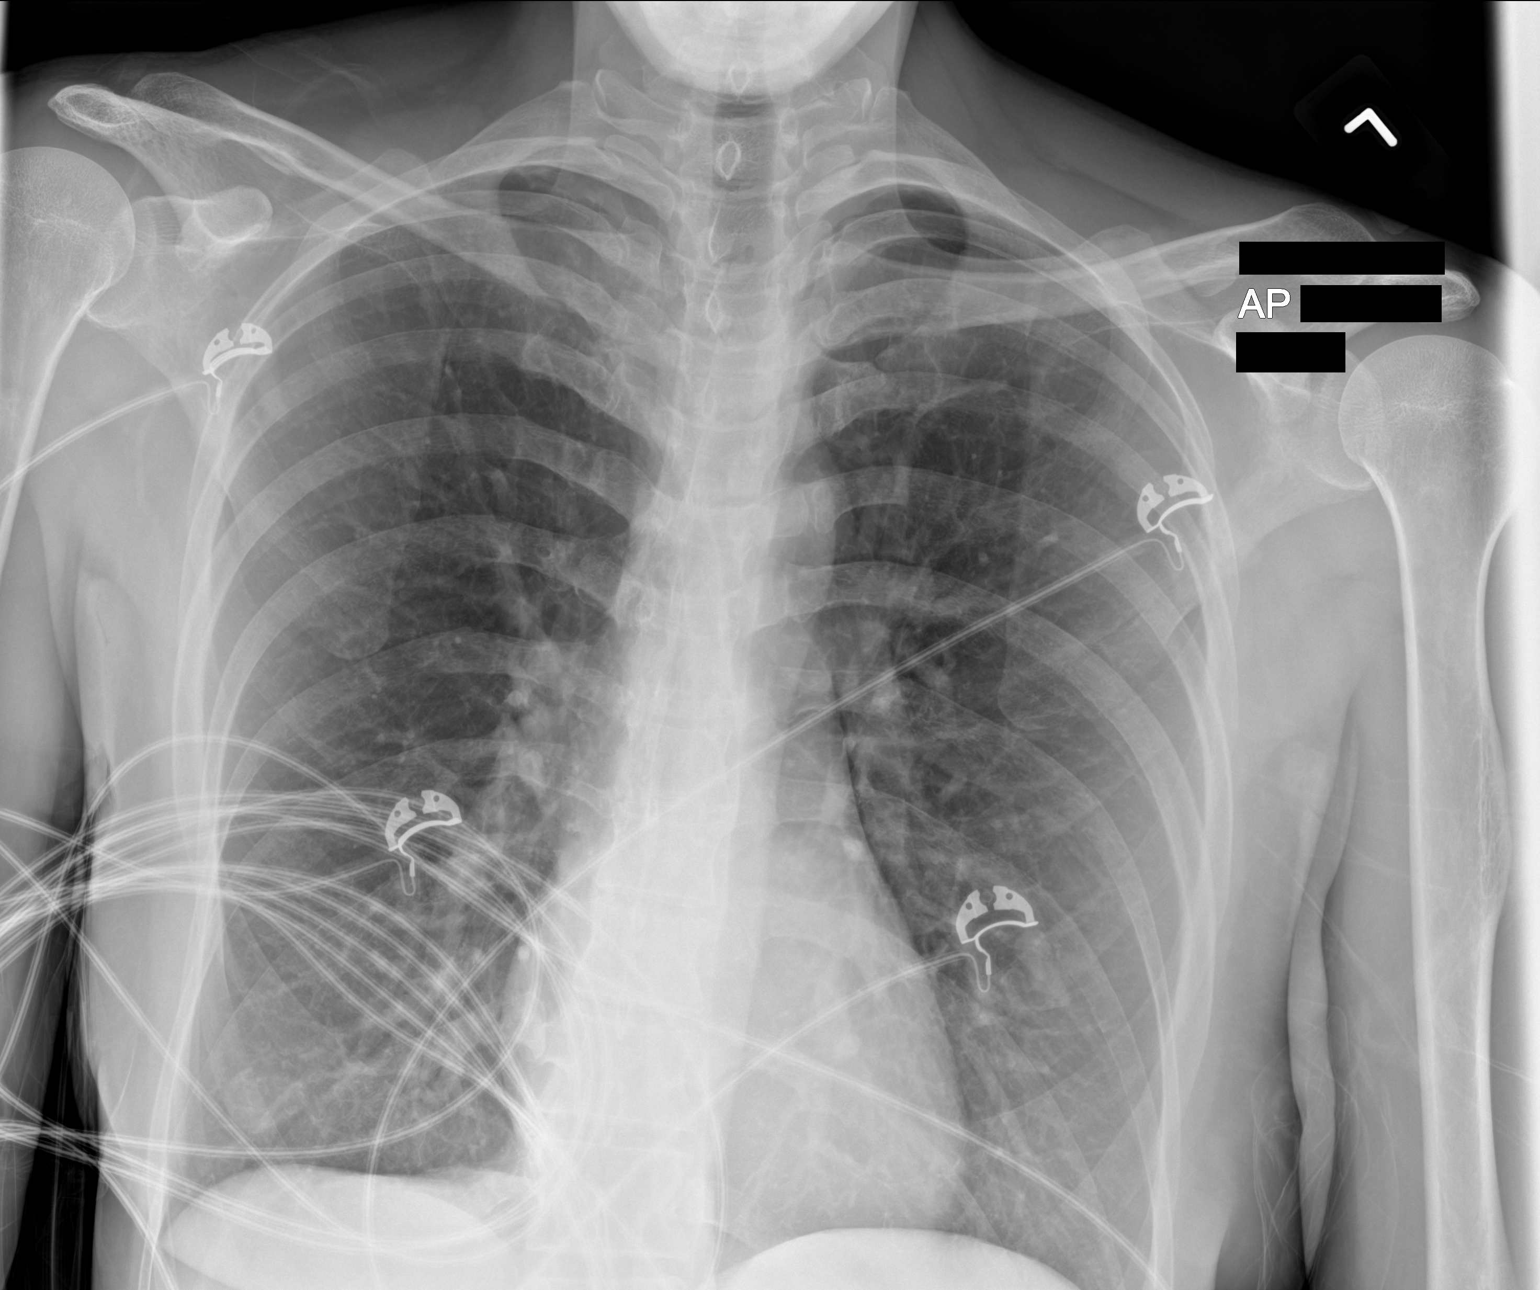

[chest ap (2 of 2)]
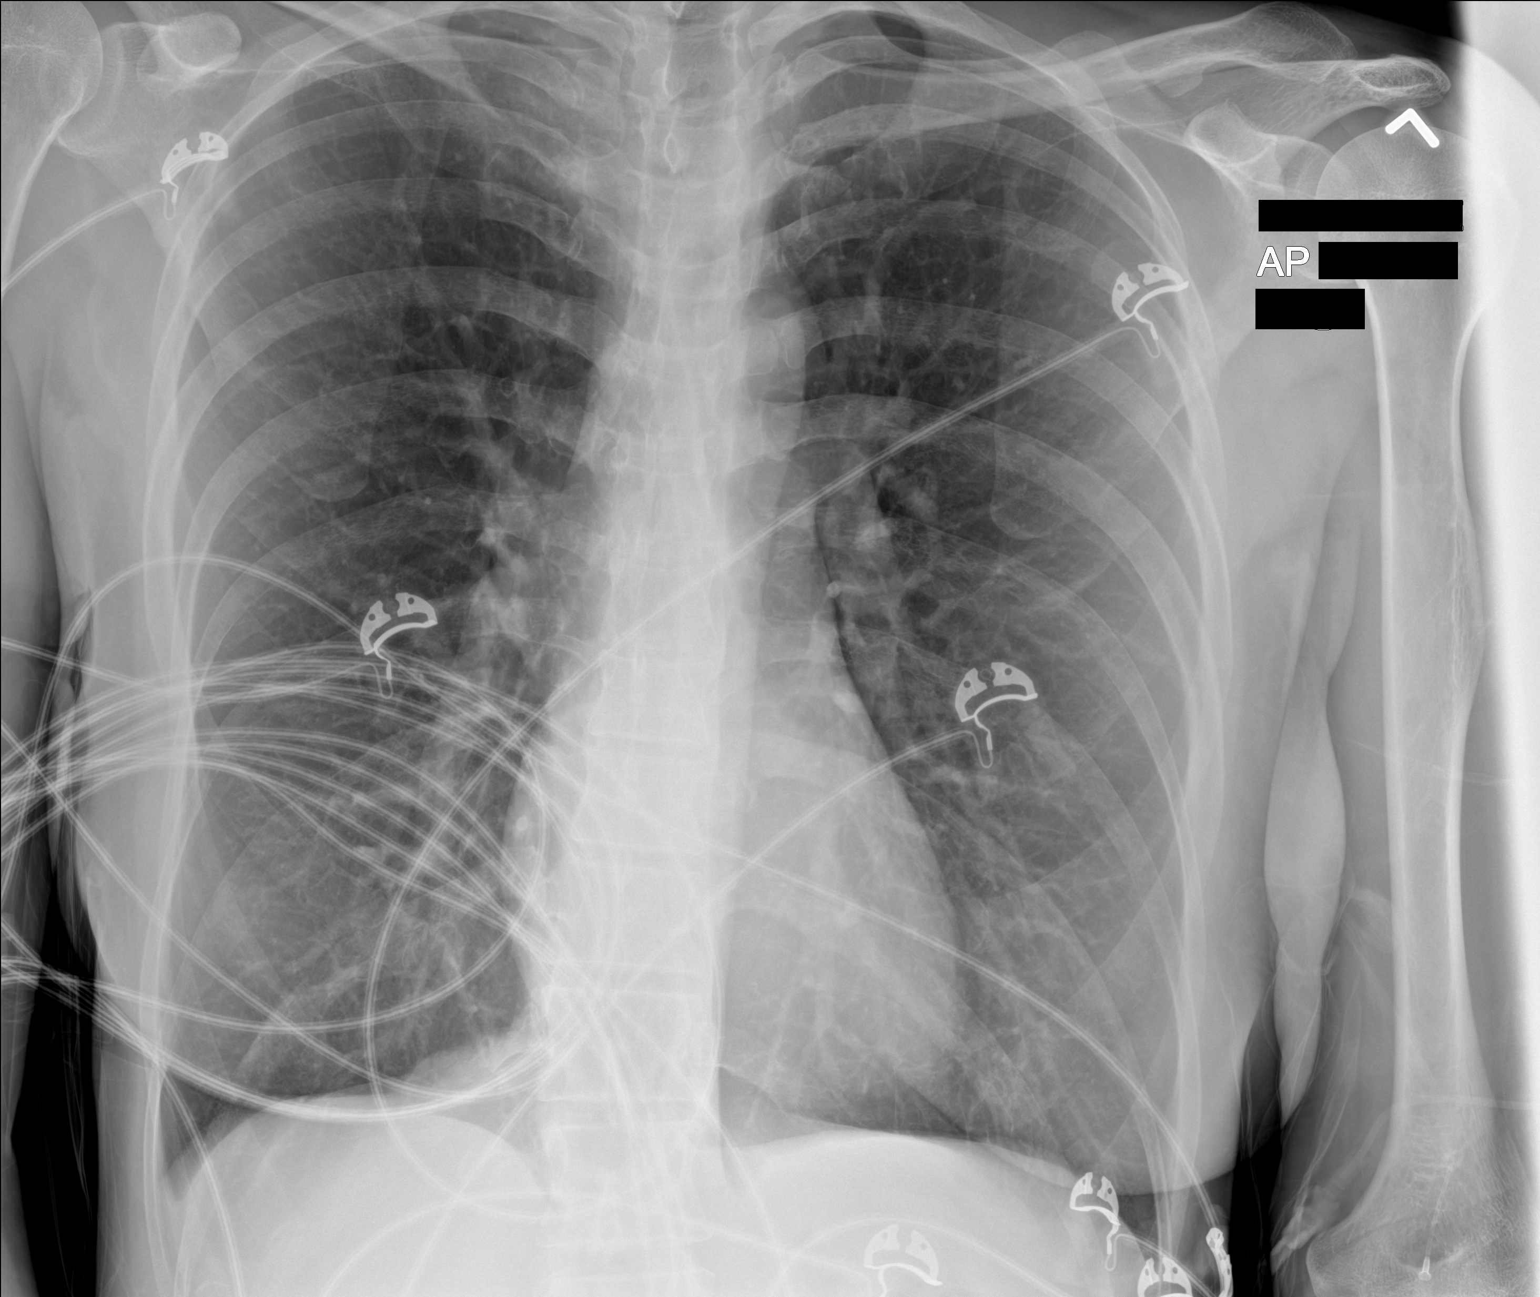

[2 of 2 positions shown; findings below may reference images not displayed]

FINDINGS: Cardiac and mediastinal silhouettes are stable in size and contour,
and remain within normal limits.

Lungs are hyperinflated with underlying emphysematous changes. No
focal infiltrates. No edema or effusion. No pneumothorax.

No acute osseous finding.
IMPRESSION: 1. Hyperinflation with underlying emphysema.
2. No other active cardiopulmonary disease.

## 2020-11-06 ENCOUNTER — Other Ambulatory Visit: Payer: Self-pay

## 2020-11-06 ENCOUNTER — Ambulatory Visit: Payer: Self-pay | Admitting: Physician Assistant

## 2020-11-06 DIAGNOSIS — N760 Acute vaginitis: Secondary | ICD-10-CM

## 2020-11-06 DIAGNOSIS — Z7189 Other specified counseling: Secondary | ICD-10-CM

## 2020-11-06 DIAGNOSIS — B3731 Acute candidiasis of vulva and vagina: Secondary | ICD-10-CM

## 2020-11-06 DIAGNOSIS — B9689 Other specified bacterial agents as the cause of diseases classified elsewhere: Secondary | ICD-10-CM

## 2020-11-06 DIAGNOSIS — B373 Candidiasis of vulva and vagina: Secondary | ICD-10-CM

## 2020-11-06 DIAGNOSIS — Z113 Encounter for screening for infections with a predominantly sexual mode of transmission: Secondary | ICD-10-CM

## 2020-11-06 LAB — WET PREP FOR TRICH, YEAST, CLUE
Trichomonas Exam: NEGATIVE
Yeast Exam: NEGATIVE

## 2020-11-06 LAB — PREGNANCY, URINE: Preg Test, Ur: NEGATIVE

## 2020-11-06 MED ORDER — METRONIDAZOLE 0.75 % VA GEL: 1.0000 | Freq: Every day | VAGINAL | 0 refills | Status: DC

## 2020-11-06 MED ORDER — CLOTRIMAZOLE 1 % VA CREA: 1.0000 | TOPICAL_CREAM | Freq: Every day | VAGINAL | 0 refills | Status: AC

## 2020-11-07 ENCOUNTER — Encounter: Payer: Self-pay | Admitting: Physician Assistant

## 2020-11-07 NOTE — Progress Notes (Addendum)
[facility] Department STI clinic/screening visit  Subjective:  Jasmine Blankenship is a 40 y.o. female being seen today for an STI screening visit. The patient reports they do have symptoms.  Patient reports that they do not desire a pregnancy in the next year.   They reported they are not interested in discussing contraception today.  No LMP recorded. (Menstrual status: Irregular Periods).   Patient has the following medical conditions:  There are no problems to display for this patient.   Chief Complaint  Patient presents with  . SEXUALLY TRANSMITTED DISEASE    screening    HPI  Patient reports that Jasmine Blankenship has had a large amount of white discharge from her vagina for a week.  Denies other symptoms, chronic conditions, surgeries and regular medicines.  Per patient her last HIV test was in 05/2020 and last pap was in 2019    .  LMP over 1.5 years ago.   See flowsheet for further details and programmatic requirements.    The following portions of the patient's history were reviewed and updated as appropriate: allergies, current medications, past medical history, past social history, past surgical history and problem list.  Objective:  There were no vitals filed for this visit.  Physical Exam Constitutional:      General: Jasmine Blankenship is not in acute distress.    Appearance: Normal appearance.  HENT:     Head: Normocephalic and atraumatic.     Comments: No nits,lice, or hair loss. No cervical, supraclavicular or axillary adenopathy.    Mouth/Throat:     Mouth: Mucous membranes are moist.     Pharynx: Oropharynx is clear. No oropharyngeal exudate or posterior oropharyngeal erythema.  Eyes:     Conjunctiva/sclera: Conjunctivae normal.  Pulmonary:     Effort: Pulmonary effort is normal.  Abdominal:     Palpations: Abdomen is soft. There is no mass.     Tenderness: There is no abdominal tenderness. There is no guarding or rebound.  Genitourinary:    General: Normal vulva.      Rectum: Normal.     Comments: External genitalia/pubic area without nits, lice, edema, erythema, lesions and inguinal adenopathy. Vagina with normal mucosa and moderate amount white, clumping discharge, pH >4.5.  Cervix without visible lesions. Uterus firm, mobile, nt, no masses, no CMT, no adnexal tenderness or fullness. Musculoskeletal:     Cervical back: Neck supple. No tenderness.  Skin:    General: Skin is warm and dry.     Findings: No bruising, erythema, lesion or rash.  Neurological:     Mental Status: Jasmine Blankenship is alert and oriented to person, place, and time.  Psychiatric:        Mood and Affect: Mood normal.        Behavior: Behavior normal.        Thought Content: Thought content normal.        Judgment: Judgment normal.      Assessment and Plan:  MYLDRED RAJU is a 40 y.o. female presenting to the [facility] Department for STI screening  1. Screening for STD (sexually transmitted disease) Patient into clinic with symptoms. Reviewed with patient wet mount results. Rec condoms with all sex. Await test results.  Counseled that RN will call if needs to RTC for treatment once results are back. - WET PREP FOR TRICH, YEAST, CLUE - Pregnancy, urine - Gonococcus culture - Chlamydia/Gonorrhea [place] [facility] - HIV [place] [facility] - Syphilis Serology, [place] [facility]  2. Candidal  vulvovaginitis Will give Clotrimazole 1% vaginal cream to use 1 app qhs for 7 days after using antibiotic gel for BV. - clotrimazole (CLOTRIMAZOLE-7) 1 % vaginal cream; Place 1 Applicatorful vaginally at bedtime for 7 days.  Dispense: 45 g; Refill: 0  3. BV (bacterial vaginosis) Will treat for BV with Vandazole 0.75% 1 app qhs for 5-7 days. No sex for 10 days. - metroNIDAZOLE (VANDAZOLE) 0.75 % vaginal gel; Place 1 Applicatorful vaginally at bedtime.  Dispense: 70 g; Refill: 0  4. Other specified counseling Recommended that patient get established with PCP or Gyn to have evaluation done regarding  why Jasmine Blankenship is not having periods. Counseled that it is possible that Jasmine Blankenship could have gone through menopause early but will need the evaluation with blood work to determine this or whether Jasmine Blankenship has something else going on that is causing her not to have period.   No follow-ups on file.  No future appointments.  Matt Holmes, PA

## 2020-11-09 NOTE — Progress Notes (Signed)
Chart reviewed by Pharmacist  Suzanne Walker PharmD, Contract Pharmacist at [facility]  

## 2020-11-10 LAB — GONOCOCCUS CULTURE

## 2020-11-12 LAB — HM HIV SCREENING LAB: HM HIV Screening: NEGATIVE

## 2021-03-31 IMAGING — CT CT HEAD W/O CM
3 series · 16 of 46 positions shown, 19 images · non-contrast
Comparison: None.

CLINICAL DATA: Dizziness and lethargy

EXAM:
CT HEAD WITHOUT CONTRAST
TECHNIQUE: Contiguous axial images were obtained from the base of the skull
through the vertex without intravenous contrast.

[Series 2: head wo · axial · 0.47mm/px · z∈[-153,-33]mm · 10 of 29 slices shown, 13 images]
[im 3/29  brain]
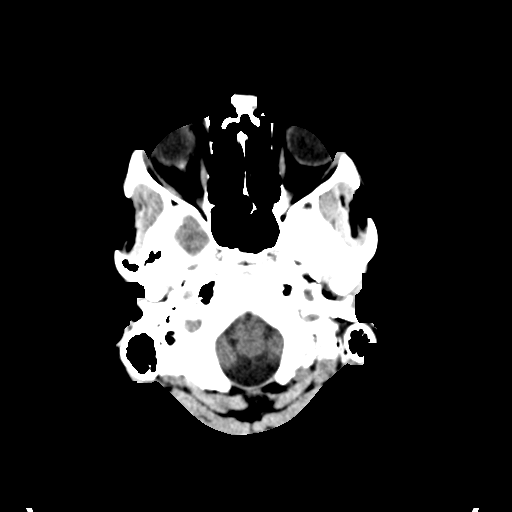
[im 3/29  bone]
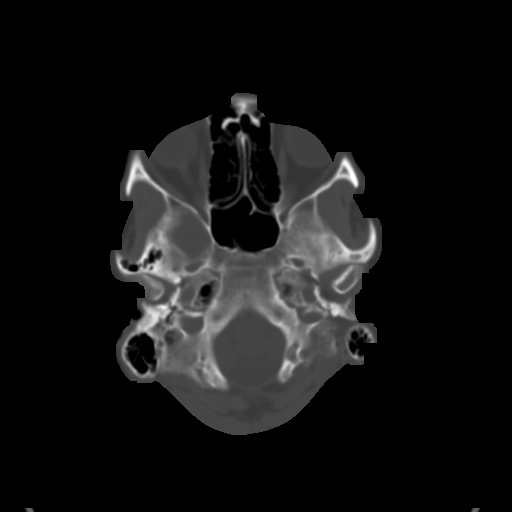
[im 6/29  brain]
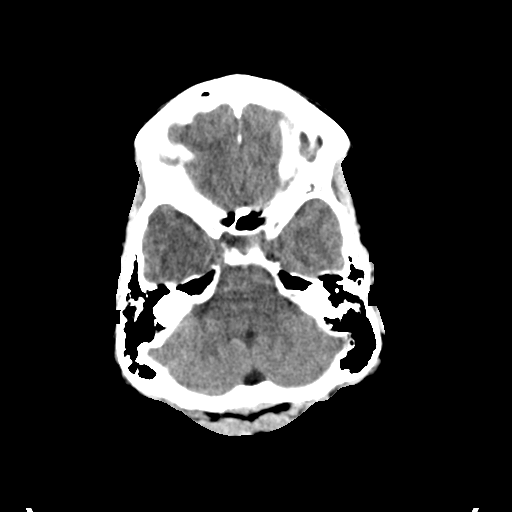
[im 8/29  brain]
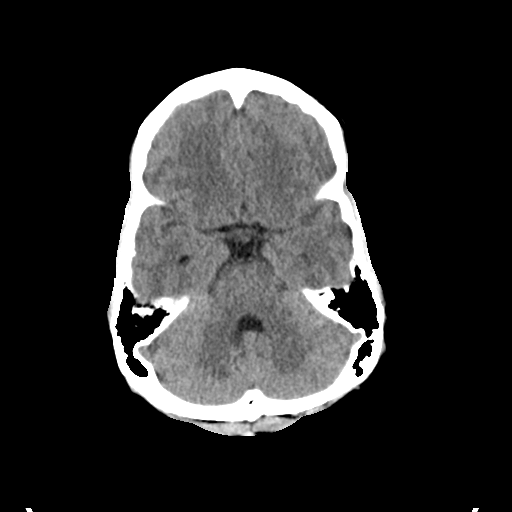
[im 11/29  brain]
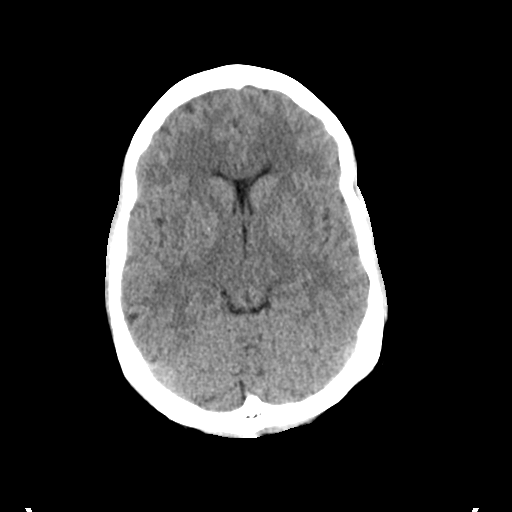
[im 14/29  brain]
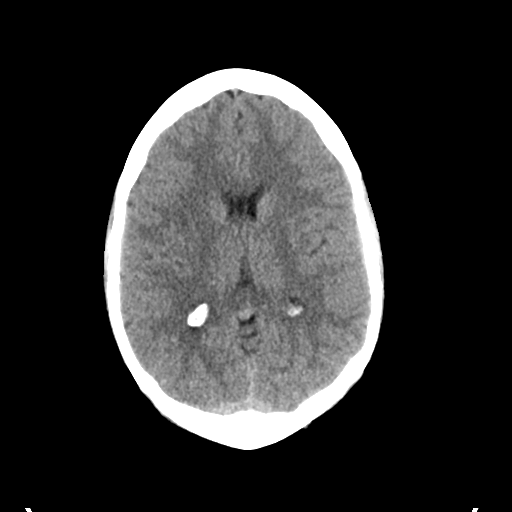
[im 14/29  bone]
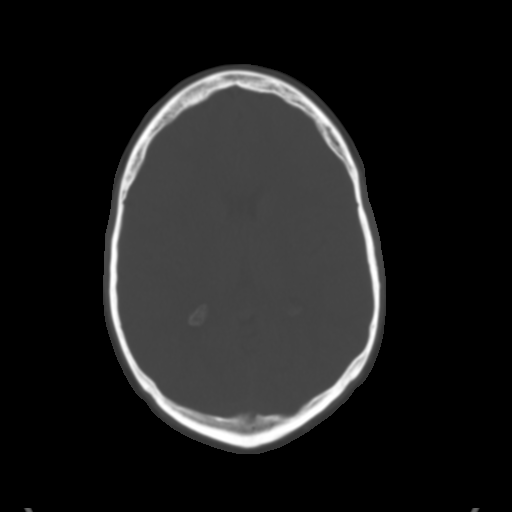
[im 16/29  brain]
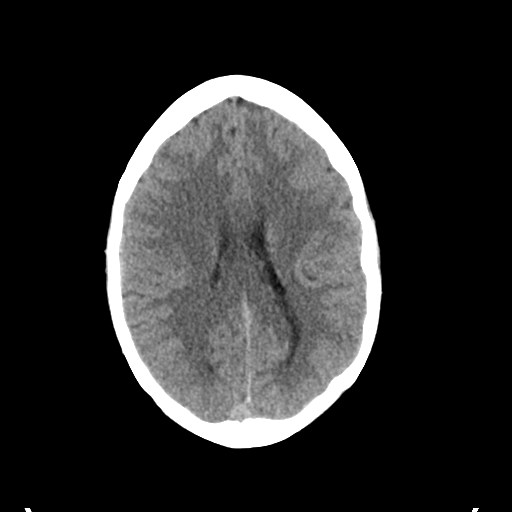
[im 19/29  brain]
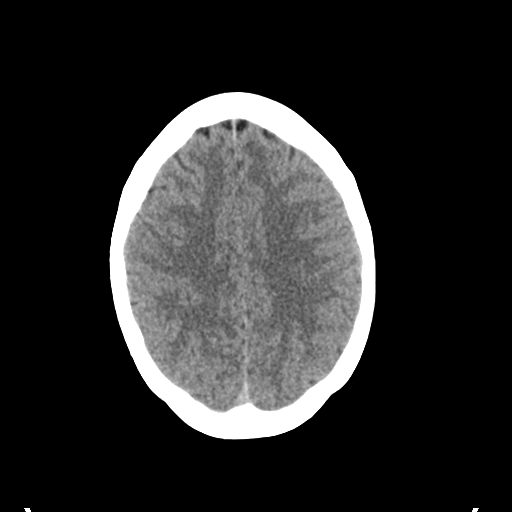
[im 22/29  brain]
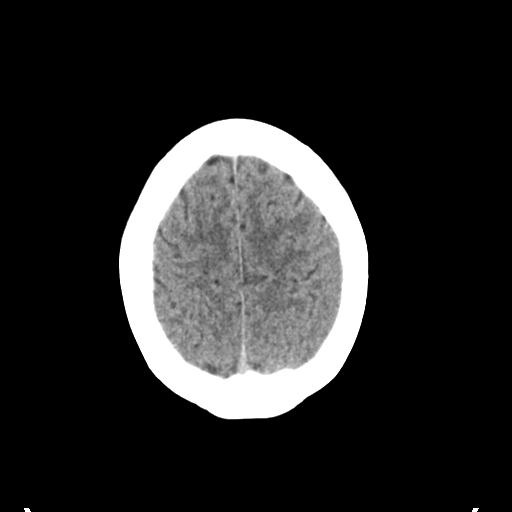
[im 24/29  brain]
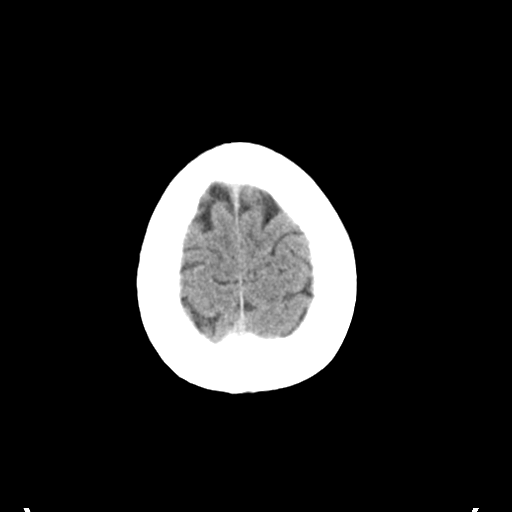
[im 24/29  bone]
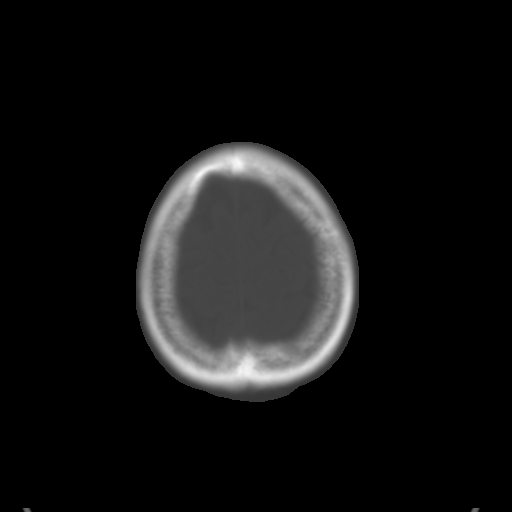
[im 27/29  brain]
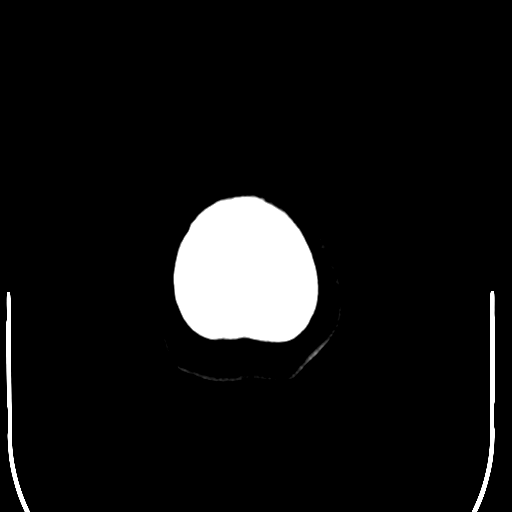

[Series 4: coronal soft tissue · coronal · 0.28mm/px · 3 of 67 slices shown]
[im 23/67  brain]
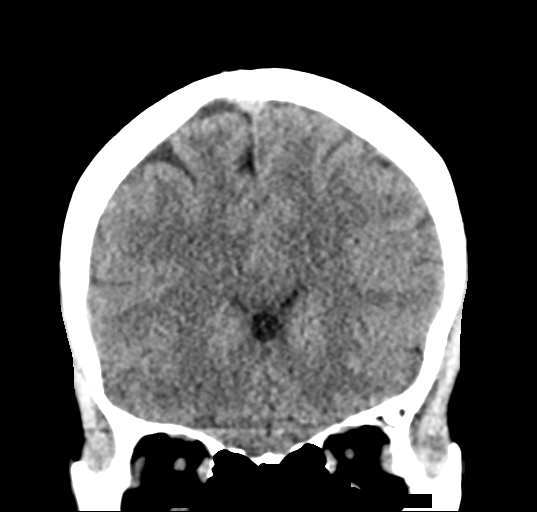
[im 30/67  brain]
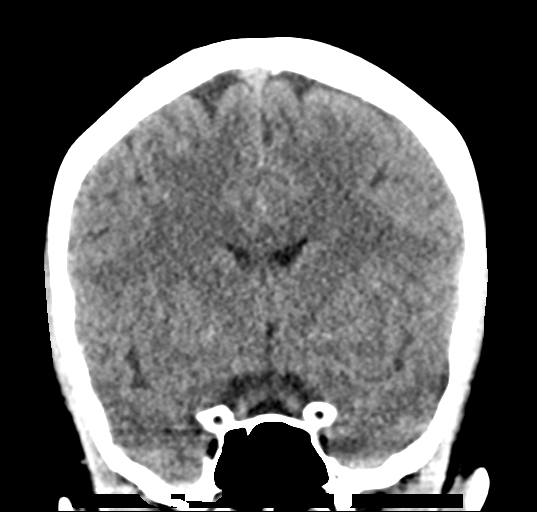
[im 37/67  brain]
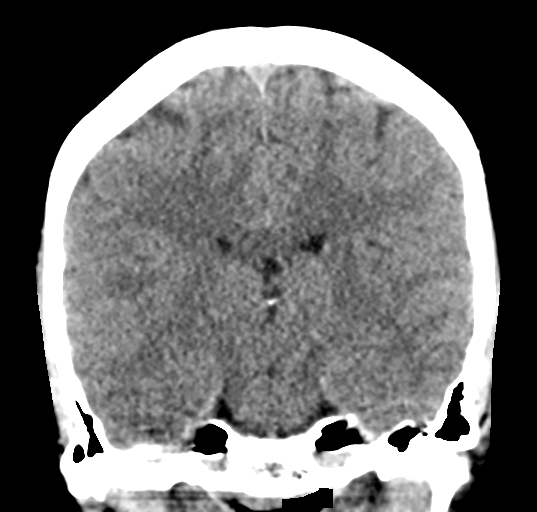

[Series 5: sagittal soft tissue · sagittal · 0.28mm/px · 3 of 50 slices shown]
[im 17/50  brain]
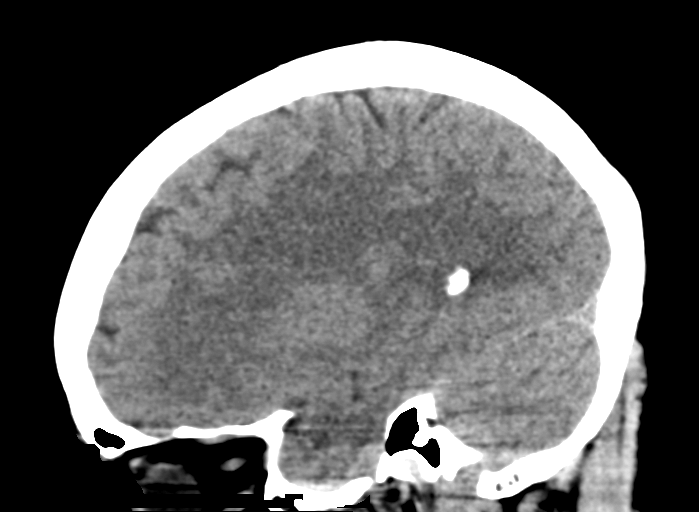
[im 25/50  brain]
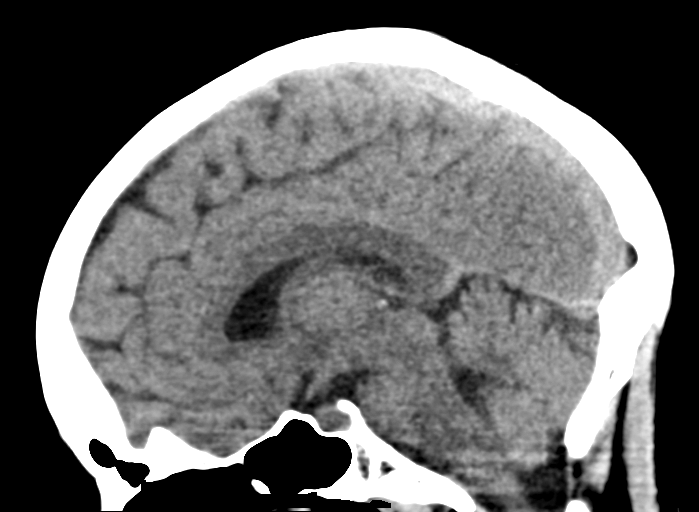
[im 33/50  brain]
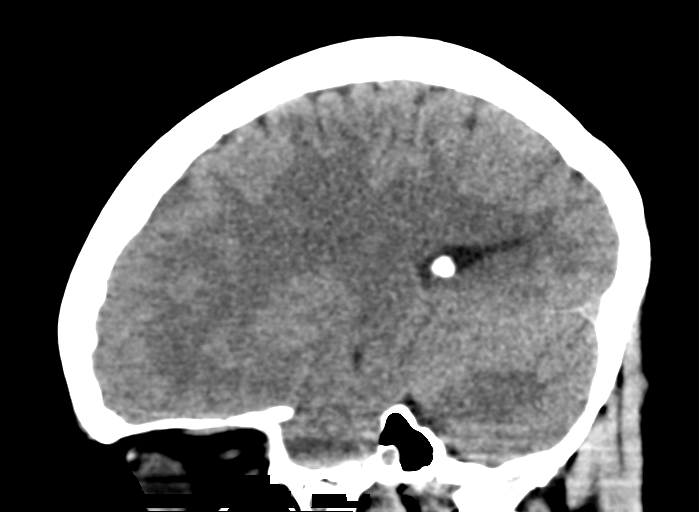

[16 of 46 positions shown; findings below may reference images not displayed]

FINDINGS: Brain: No evidence of acute infarction, hemorrhage, hydrocephalus,
extra-axial collection or mass lesion/mass effect.

Vascular: No hyperdense vessel or unexpected calcification.

Skull: Normal. Negative for fracture or focal lesion.

Sinuses/Orbits: No acute finding.

Other: None.
IMPRESSION: Normal head CT for age.

## 2021-07-03 ENCOUNTER — Ambulatory Visit: Payer: Self-pay

## 2021-07-10 ENCOUNTER — Other Ambulatory Visit: Payer: Self-pay

## 2021-07-10 ENCOUNTER — Ambulatory Visit: Payer: Self-pay | Admitting: Nurse Practitioner

## 2021-07-10 ENCOUNTER — Ambulatory Visit: Payer: Self-pay

## 2021-07-10 DIAGNOSIS — Z113 Encounter for screening for infections with a predominantly sexual mode of transmission: Secondary | ICD-10-CM

## 2021-07-10 DIAGNOSIS — F339 Major depressive disorder, recurrent, unspecified: Secondary | ICD-10-CM | POA: Insufficient documentation

## 2021-07-10 LAB — HM HIV SCREENING LAB: HM HIV Screening: NEGATIVE

## 2021-07-10 LAB — HEPATITIS B SURFACE ANTIGEN: Hepatitis B Surface Ag: NONREACTIVE

## 2021-07-10 LAB — WET PREP FOR TRICH, YEAST, CLUE
Trichomonas Exam: NEGATIVE
Yeast Exam: NEGATIVE

## 2021-07-10 LAB — HM HEPATITIS C SCREENING LAB: HM Hepatitis Screen: NEGATIVE

## 2021-07-10 NOTE — Progress Notes (Signed)
[facility] Department  STI clinic/screening visit [address] [place] [address] [phone]  Subjective:  Jasmine Blankenship is a 41 y.o. female being seen today for an STI screening visit. The patient reports they do have symptoms.  Patient reports that they do not desire a pregnancy in the next year.   They reported they are not interested in discussing contraception today.    No LMP recorded (lmp unknown). (Menstrual status: Irregular Periods).   Patient has the following medical conditions:  There are no problems to display for this patient.   Chief Complaint  Patient presents with   SEXUALLY TRANSMITTED DISEASE    screening    HPI  Patient reports to clinic today for STD screening.  Patient reports genital itching for 2 weeks and discharge and sore throat for 2-3 days.    Last HIV test per patient/review of record was 11/12/2020 Patient reports last pap was 08/20/2017.   Screening for MPX risk: Does the patient have an unexplained rash? No Is the patient MSM? No Does the patient endorse multiple sex partners or anonymous sex partners? No Did the patient have close or sexual contact with a person diagnosed with MPX? No Has the patient traveled outside the [place] where MPX is endemic? No Is there a high clinical suspicion for MPX-- evidenced by one of the following No  -Unlikely to be chickenpox  -Lymphadenopathy  -Rash that present in same phase of evolution on any given body part See flowsheet for further details and programmatic requirements.    The following portions of the patient's history were reviewed and updated as appropriate: allergies, current medications, past medical history, past social history, past surgical history and problem list.  Objective:  There were no vitals filed for this visit.  Physical Exam HENT:     Mouth/Throat:     Comments: Dental caries noted.  Pulmonary:     Effort: Pulmonary effort is normal.  Abdominal:      General: Abdomen is flat.     Palpations: Abdomen is soft.  Genitourinary:    Comments: External genitalia/pubic area without nits, lice, edema, erythema, lesions and inguinal adenopathy. Vagina with normal mucosa and discharge. Cervix without visible lesions. Uterus firm, mobile, nt, no masses, no CMT, no adnexal tenderness or fullness. pH 4.5. Musculoskeletal:     Cervical back: Full passive range of motion without pain, normal range of motion and neck supple.  Skin:    General: Skin is warm and dry.  Neurological:     Mental Status: She is alert and oriented to person, place, and time.  Psychiatric:        Attention and Perception: Attention normal.        Behavior: Behavior is cooperative.     Assessment and Plan:  Jasmine Blankenship is a 41 y.o. female presenting to the [facility] [place] [facility] Department for STI screening  1. Screening examination for venereal disease -41 year old female in clinic today for STD screening.   -Patient accepted all screenings including oral, vaginal CT/GC and bloodwork for HIV/RPR, HBV/HCV Patient meets criteria for HepB screening? Yes. Ordered? Yes Patient meets criteria for HepC screening? Yes. Ordered? Yes  Discussed time line for [facility] results and that patient will be called with positive results and encouraged patient to call if she had not heard in 2 weeks.  Counseled to return or seek care for continued or worsening symptoms Recommended condom use with all sex  Patient is currently  using  condoms   to prevent pregnancy.   - WET PREP FOR TRICH, YEAST, CLUE - Gonococcus culture - HBV Antigen/Antibody [place] [facility] - HIV/HCV [place] [facility] - Chlamydia/Gonorrhea [place] [facility] - Syphilis Serology, [place] [facility]  -Wet mount reviewed, no treatment needed.   2. Depression, recurrent (HCC) -Patient states she suffers from depression at times.  Patient states she had never discussed depression with a professional and declines services at this  time.  Denies self harm.  Kathreen Cosier, LCSW card given to patient.       Return if symptoms worsen or fail to improve.  No future appointments.  Glenna Fellows, FNP

## 2021-07-14 LAB — GONOCOCCUS CULTURE

## 2021-08-27 ENCOUNTER — Other Ambulatory Visit: Payer: Self-pay

## 2021-08-27 ENCOUNTER — Ambulatory Visit: Payer: Self-pay | Admitting: Advanced Practice Midwife

## 2021-08-27 DIAGNOSIS — Z5321 Procedure and treatment not carried out due to patient leaving prior to being seen by health care provider: Secondary | ICD-10-CM

## 2021-08-27 NOTE — Progress Notes (Signed)
Pt left without being seen by provider as provider was double booked

## 2022-06-17 ENCOUNTER — Encounter: Payer: Self-pay | Admitting: Family Medicine

## 2022-06-17 ENCOUNTER — Ambulatory Visit: Payer: Self-pay | Admitting: Family Medicine

## 2022-06-17 DIAGNOSIS — Z789 Other specified health status: Secondary | ICD-10-CM

## 2022-06-17 DIAGNOSIS — Z113 Encounter for screening for infections with a predominantly sexual mode of transmission: Secondary | ICD-10-CM

## 2022-06-17 LAB — HM HIV SCREENING LAB: HM HIV Screening: NEGATIVE

## 2022-06-17 LAB — HEPATITIS B SURFACE ANTIGEN: Hepatitis B Surface Ag: NONREACTIVE

## 2022-06-17 LAB — WET PREP FOR TRICH, YEAST, CLUE
Trichomonas Exam: NEGATIVE
Yeast Exam: NEGATIVE

## 2022-06-17 LAB — HM HEPATITIS C SCREENING LAB: HM Hepatitis Screen: NEGATIVE

## 2022-06-17 NOTE — Progress Notes (Signed)
[facility] Department  STI clinic/screening visit [address] [place] [address] [phone]  Subjective:  MERCEDES FORT is a 41 y.o. female being seen today for an STI screening visit. The patient reports they do have symptoms.  Patient reports that they do not desire a pregnancy in the next year.   They reported they are not interested in discussing contraception today.    No LMP recorded. (Menstrual status: Irregular Periods).   Patient has the following medical conditions:   Patient Active Problem List   Diagnosis Date Noted   Alcohol use 06/17/2022   Depression, recurrent (HCC) 07/10/2021    Chief Complaint  Patient presents with   SEXUALLY TRANSMITTED DISEASE    Screening- patient is complaining of vaginal discharge and burning when urinating      HPI  Patient reports to clinic for STI testing. States she is concerned about the odor. Tearful during visit- states "I know my coworkers are whispering about me and my odor, this isn't good something must be wrong with me".   Does the patient using douching products? No  Last HIV test per patient/review of record was  Lab Results  Component Value Date   HMHIVSCREEN Negative - Validated 07/10/2021    Lab Results  Component Value Date   HIV Non Reactive 07/02/2018   Patient reports last pap was 08/2017.  Screening for MPX risk: Does the patient have an unexplained rash? No Is the patient MSM? No Does the patient endorse multiple sex partners or anonymous sex partners? No Did the patient have close or sexual contact with a person diagnosed with MPX? No Has the patient traveled outside the [place] where MPX is endemic? No Is there a high clinical suspicion for MPX-- evidenced by one of the following No  -Unlikely to be chickenpox  -Lymphadenopathy  -Rash that present in same phase of evolution on any given body part See flowsheet for further details and programmatic requirements.   Immunization  history:  Immunization History  Administered Date(s) Administered   Tdap 02/21/2007     The following portions of the patient's history were reviewed and updated as appropriate: allergies, current medications, past medical history, past social history, past surgical history and problem list.  Objective:  There were no vitals filed for this visit.  Physical Exam Vitals and nursing note reviewed.  Constitutional:      Appearance: Normal appearance.  HENT:     Head: Normocephalic and atraumatic.     Mouth/Throat:     Mouth: Mucous membranes are moist.     Pharynx: Oropharynx is clear. No oropharyngeal exudate or posterior oropharyngeal erythema.  Pulmonary:     Effort: Pulmonary effort is normal.  Abdominal:     General: Abdomen is flat.     Palpations: There is no mass.     Tenderness: There is no abdominal tenderness. There is no rebound.  Genitourinary:    General: Normal vulva.     Exam position: Lithotomy position.     Pubic Area: No rash or pubic lice.      Labia:        Right: No rash or lesion.        Left: No rash or lesion.      Vagina: Normal. No vaginal discharge, erythema, bleeding or lesions.     Cervix: No cervical motion tenderness, discharge, friability, lesion or erythema.     Uterus: Normal.      Adnexa: Right adnexa normal and left  adnexa normal.     Rectum: Normal.     Comments: pH = 4  Possible strawberry cervix. Small amount of white discharge present Lymphadenopathy:     Head:     Right side of head: No preauricular or posterior auricular adenopathy.     Left side of head: No preauricular or posterior auricular adenopathy.     Cervical: No cervical adenopathy.     Upper Body:     Right upper body: No supraclavicular, axillary or epitrochlear adenopathy.     Left upper body: No supraclavicular, axillary or epitrochlear adenopathy.     Lower Body: No right inguinal adenopathy. No left inguinal adenopathy.  Skin:    General: Skin is warm and dry.      Findings: No rash.  Neurological:     Mental Status: She is alert and oriented to person, place, and time.      Assessment and Plan:  PAIZLIE KLAUS is a 41 y.o. female presenting to the [facility] Department for STI screening  1. Screening for venereal disease Patient accepted all screenings including oral, vaginal CT/GC and bloodwork for HIV/RPR and wet prep. Patient meets criteria for HepB screening? Yes. Ordered? Yes Patient meets criteria for HepC screening? Yes. Ordered? Yes  Treat wet prep per standing order Discussed time line for State Lab results and that patient will be called with positive results and encouraged patient to call if she had not heard in 2 weeks.  Counseled to return or seek care for continued or worsening symptoms Recommended condom use with all sex  Patient's wet prep came back negative. Patient tearful and upset by this, RN discussed with patient that the GC/Chlamydia isn't back yet. pH today was normal. I did see possible strawberry cervix on exam, however trich testing was negative. Small amount of white discharge present. Possibly patient's normal flora.   Patient is currently using  nothing   to prevent pregnancy.    - Syphilis Serology, [place] [facility] - Chlamydia/Gonorrhea [place] [facility] - WET PREP FOR TRICH, YEAST, CLUE - HIV/HCV [place] [facility] - HBV Antigen/Antibody [place] [facility] - Chlamydia/Gonorrhea [place] [facility]     Return if symptoms worsen or fail to improve.  Total time spent 25 minutes.   Lenice Llamas, [place]

## 2022-06-26 ENCOUNTER — Telehealth: Payer: Self-pay | Admitting: Family Medicine

## 2022-06-26 NOTE — Telephone Encounter (Signed)
Please call me @ [phone] I need my test results.

## 2022-06-26 NOTE — Telephone Encounter (Signed)
Call to client who desires STI testing results from 06/17/2022. Client correctly provided DOB, last 4 numbers of SSN and password. Test results - all negative / non-reactive provided verbally to client. Jossie Ng, RN

## 2023-01-22 ENCOUNTER — Emergency Department: Payer: Self-pay

## 2023-01-22 ENCOUNTER — Other Ambulatory Visit: Payer: Self-pay

## 2023-01-22 ENCOUNTER — Emergency Department
Admission: EM | Admit: 2023-01-22 | Discharge: 2023-01-23 | Disposition: A | Discharge status: Home / Self Care | Payer: Self-pay | Attending: Student in an Organized Health Care Education/Training Program | Admitting: Student in an Organized Health Care Education/Training Program

## 2023-01-22 DIAGNOSIS — S0990XA Unspecified injury of head, initial encounter: Secondary | ICD-10-CM

## 2023-01-22 DIAGNOSIS — S01111A Laceration without foreign body of right eyelid and periocular area, initial encounter: Secondary | ICD-10-CM | POA: Insufficient documentation

## 2023-01-22 DIAGNOSIS — S0083XA Contusion of other part of head, initial encounter: Secondary | ICD-10-CM

## 2023-01-22 DIAGNOSIS — H11421 Conjunctival edema, right eye: Secondary | ICD-10-CM | POA: Insufficient documentation

## 2023-01-22 MED ORDER — LIDOCAINE-EPINEPHRINE 2 %-1:100000 IJ SOLN
20.0000 mL | Freq: Once | INTRAMUSCULAR | Status: AC
  Administered 2023-01-22: 20 mL
  Filled 2023-01-22: qty 1

## 2023-01-22 MED ORDER — LIDOCAINE-EPINEPHRINE-TETRACAINE (LET) TOPICAL GEL
3.0000 mL | Freq: Once | TOPICAL | Status: DC
  Filled 2023-01-22 (×2): qty 3
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
## 2023-11-25 ENCOUNTER — Ambulatory Visit: Payer: Self-pay

## 2024-08-08 ENCOUNTER — Ambulatory Visit: Payer: Self-pay
